# Patient Record
Sex: Female | Born: 1950
Health system: Southern US, Community
[De-identification: ages and names within clinical notes are randomized; demographics above are authoritative.]

## PROBLEM LIST (undated history)

## (undated) DIAGNOSIS — K5792 Diverticulitis of intestine, part unspecified, without perforation or abscess without bleeding: Secondary | ICD-10-CM

## (undated) DIAGNOSIS — N289 Disorder of kidney and ureter, unspecified: Secondary | ICD-10-CM

## (undated) DIAGNOSIS — L570 Actinic keratosis: Secondary | ICD-10-CM

## (undated) DIAGNOSIS — K589 Irritable bowel syndrome without diarrhea: Secondary | ICD-10-CM

## (undated) HISTORY — PX: BREAST SURGERY: SHX581

## (undated) HISTORY — DX: Actinic keratosis: L57.0

---

## 1990-07-21 HISTORY — PX: BREAST EXCISIONAL BIOPSY: SUR124

## 2004-10-18 ENCOUNTER — Ambulatory Visit: Payer: Self-pay | Admitting: Obstetrics and Gynecology

## 2005-06-19 ENCOUNTER — Ambulatory Visit: Payer: Self-pay | Admitting: Unknown Physician Specialty

## 2005-07-10 ENCOUNTER — Ambulatory Visit: Payer: Self-pay | Admitting: Unknown Physician Specialty

## 2005-10-20 ENCOUNTER — Ambulatory Visit: Payer: Self-pay | Admitting: Obstetrics and Gynecology

## 2006-11-24 ENCOUNTER — Ambulatory Visit: Payer: Self-pay | Admitting: Obstetrics and Gynecology

## 2007-11-26 ENCOUNTER — Ambulatory Visit: Payer: Self-pay | Admitting: Obstetrics and Gynecology

## 2008-02-04 ENCOUNTER — Ambulatory Visit: Payer: Self-pay | Admitting: Unknown Physician Specialty

## 2008-09-26 ENCOUNTER — Encounter (HOSPITAL_COMMUNITY): Admission: RE | Admit: 2008-09-26 | Discharge: 2008-10-26 | Payer: Self-pay | Admitting: Internal Medicine

## 2008-11-27 ENCOUNTER — Ambulatory Visit: Payer: Self-pay | Admitting: Obstetrics and Gynecology

## 2010-08-05 ENCOUNTER — Ambulatory Visit (HOSPITAL_COMMUNITY)
Admission: RE | Admit: 2010-08-05 | Discharge: 2010-08-05 | Payer: Self-pay | Source: Home / Self Care | Attending: Family Medicine | Admitting: Family Medicine

## 2011-09-11 ENCOUNTER — Other Ambulatory Visit (HOSPITAL_COMMUNITY): Payer: Self-pay | Admitting: Family Medicine

## 2011-09-11 DIAGNOSIS — Z139 Encounter for screening, unspecified: Secondary | ICD-10-CM

## 2011-09-18 ENCOUNTER — Ambulatory Visit (HOSPITAL_COMMUNITY)
Admission: RE | Admit: 2011-09-18 | Discharge: 2011-09-18 | Disposition: A | Discharge status: Home / Self Care | Payer: PRIVATE HEALTH INSURANCE | Source: Ambulatory Visit | Attending: Family Medicine | Admitting: Family Medicine

## 2011-09-18 DIAGNOSIS — Z1231 Encounter for screening mammogram for malignant neoplasm of breast: Secondary | ICD-10-CM | POA: Insufficient documentation

## 2011-09-18 DIAGNOSIS — Z139 Encounter for screening, unspecified: Secondary | ICD-10-CM

## 2013-01-26 ENCOUNTER — Ambulatory Visit: Payer: Self-pay | Admitting: Obstetrics and Gynecology

## 2013-03-03 ENCOUNTER — Ambulatory Visit: Payer: Self-pay | Admitting: Unknown Physician Specialty

## 2013-03-07 LAB — PATHOLOGY REPORT

## 2013-04-20 ENCOUNTER — Ambulatory Visit: Payer: Self-pay | Admitting: Obstetrics and Gynecology

## 2013-04-20 HISTORY — PX: BLADDER SURGERY: SHX569

## 2013-04-20 LAB — URINALYSIS, COMPLETE
Glucose,UR: NEGATIVE mg/dL (ref 0–75)
WBC UR: 1 /HPF (ref 0–5)

## 2013-04-20 LAB — HEMOGLOBIN: HGB: 14 g/dL (ref 12.0–16.0)

## 2013-04-25 ENCOUNTER — Ambulatory Visit: Payer: Self-pay | Admitting: Obstetrics and Gynecology

## 2013-04-26 LAB — HEMATOCRIT: HCT: 33.9 % — ABNORMAL LOW (ref 35.0–47.0)

## 2013-04-29 ENCOUNTER — Encounter (HOSPITAL_COMMUNITY): Payer: Self-pay | Admitting: Emergency Medicine

## 2013-04-29 ENCOUNTER — Emergency Department (HOSPITAL_COMMUNITY): Payer: BC Managed Care – PPO

## 2013-04-29 ENCOUNTER — Emergency Department (HOSPITAL_COMMUNITY)
Admission: EM | Admit: 2013-04-29 | Discharge: 2013-04-29 | Disposition: A | Discharge status: Home / Self Care | Payer: BC Managed Care – PPO | Attending: Emergency Medicine | Admitting: Emergency Medicine

## 2013-04-29 DIAGNOSIS — R350 Frequency of micturition: Secondary | ICD-10-CM | POA: Insufficient documentation

## 2013-04-29 DIAGNOSIS — M545 Low back pain, unspecified: Secondary | ICD-10-CM | POA: Insufficient documentation

## 2013-04-29 DIAGNOSIS — Z79899 Other long term (current) drug therapy: Secondary | ICD-10-CM | POA: Insufficient documentation

## 2013-04-29 DIAGNOSIS — R109 Unspecified abdominal pain: Secondary | ICD-10-CM | POA: Insufficient documentation

## 2013-04-29 DIAGNOSIS — R339 Retention of urine, unspecified: Secondary | ICD-10-CM | POA: Insufficient documentation

## 2013-04-29 DIAGNOSIS — R3915 Urgency of urination: Secondary | ICD-10-CM | POA: Insufficient documentation

## 2013-04-29 DIAGNOSIS — Z9889 Other specified postprocedural states: Secondary | ICD-10-CM | POA: Insufficient documentation

## 2013-04-29 LAB — CBC WITH DIFFERENTIAL/PLATELET
Basophils Absolute: 0 10*3/uL (ref 0.0–0.1)
Basophils Relative: 0 % (ref 0–1)
Eosinophils Absolute: 0.1 10*3/uL (ref 0.0–0.7)
Eosinophils Relative: 2 % (ref 0–5)
HCT: 41.2 % (ref 36.0–46.0)
Hemoglobin: 14.7 g/dL (ref 12.0–15.0)
MCH: 31.1 pg (ref 26.0–34.0)
MCHC: 35.7 g/dL (ref 30.0–36.0)
Monocytes Absolute: 0.5 10*3/uL (ref 0.1–1.0)
Monocytes Relative: 6 % (ref 3–12)
RDW: 12.7 % (ref 11.5–15.5)

## 2013-04-29 LAB — URINALYSIS, ROUTINE W REFLEX MICROSCOPIC
Bilirubin Urine: NEGATIVE
Glucose, UA: NEGATIVE mg/dL
Ketones, ur: NEGATIVE mg/dL
pH: 8.5 — ABNORMAL HIGH (ref 5.0–8.0)

## 2013-04-29 LAB — BASIC METABOLIC PANEL
BUN: 11 mg/dL (ref 6–23)
Calcium: 10.3 mg/dL (ref 8.4–10.5)
Creatinine, Ser: 0.79 mg/dL (ref 0.50–1.10)
GFR calc Af Amer: >90 mL/min (ref >90)
GFR calc non Af Amer: 89 mL/min — ABNORMAL LOW (ref >90)

## 2013-04-29 LAB — URINE MICROSCOPIC-ADD ON

## 2013-04-29 MED ORDER — HYDROMORPHONE HCL PF 1 MG/ML IJ SOLN
1.0000 mg | Freq: Once | INTRAMUSCULAR | Status: AC
  Administered 2013-04-29: 1 mg via INTRAVENOUS
  Filled 2013-04-29: qty 1

## 2013-04-29 MED ORDER — DIAZEPAM 5 MG PO TABS
5.0000 mg | ORAL_TABLET | Freq: Once | ORAL | Status: AC
  Administered 2013-04-29: 5 mg via ORAL
  Filled 2013-04-29: qty 1

## 2013-04-29 MED ORDER — KETOROLAC TROMETHAMINE 30 MG/ML IJ SOLN
30.0000 mg | Freq: Once | INTRAMUSCULAR | Status: AC
  Administered 2013-04-29: 30 mg via INTRAVENOUS

## 2013-04-29 MED ORDER — ONDANSETRON HCL 4 MG/2ML IJ SOLN: 4.0000 mg | Freq: Once | INTRAMUSCULAR | Status: DC

## 2013-04-29 MED ORDER — DIPHENHYDRAMINE HCL 50 MG/ML IJ SOLN
INTRAMUSCULAR | Status: AC
  Administered 2013-04-29: 25 mg via INTRAVENOUS
  Filled 2013-04-29: qty 1

## 2013-04-29 MED ORDER — FENTANYL CITRATE 0.05 MG/ML IJ SOLN
50.0000 ug | Freq: Once | INTRAMUSCULAR | Status: AC
  Administered 2013-04-29: 50 ug via INTRAVENOUS

## 2013-04-29 MED ORDER — IOHEXOL 300 MG/ML  SOLN
100.0000 mL | Freq: Once | INTRAMUSCULAR | Status: AC | PRN
  Administered 2013-04-29: 100 mL via INTRAVENOUS

## 2013-04-29 MED ORDER — KETOROLAC TROMETHAMINE 30 MG/ML IJ SOLN
INTRAMUSCULAR | Status: AC
  Administered 2013-04-29: 30 mg via INTRAVENOUS
  Filled 2013-04-29: qty 1

## 2013-04-29 MED ORDER — ONDANSETRON HCL 4 MG/2ML IJ SOLN
INTRAMUSCULAR | Status: AC
  Filled 2013-04-29: qty 2

## 2013-04-29 MED ORDER — IOHEXOL 300 MG/ML  SOLN
50.0000 mL | Freq: Once | INTRAMUSCULAR | Status: AC | PRN
  Administered 2013-04-29: 50 mL via ORAL

## 2013-04-29 MED ORDER — DIPHENHYDRAMINE HCL 50 MG/ML IJ SOLN
25.0000 mg | Freq: Once | INTRAMUSCULAR | Status: AC
  Administered 2013-04-29: 25 mg via INTRAVENOUS

## 2013-04-29 MED ORDER — FENTANYL CITRATE 0.05 MG/ML IJ SOLN
INTRAMUSCULAR | Status: AC
  Filled 2013-04-29: qty 2

## 2013-04-29 MED ORDER — ONDANSETRON HCL 4 MG/2ML IJ SOLN
4.0000 mg | Freq: Once | INTRAMUSCULAR | Status: AC
  Administered 2013-04-29: 4 mg via INTRAVENOUS

## 2013-04-29 NOTE — ED Notes (Signed)
Inserted 57F foley catheter w/return of 600 cc + clear yellow urine.  Patient experiencing immediate relief of pain.

## 2013-04-29 NOTE — ED Notes (Signed)
Patient states she has not had BM since Sunday.

## 2013-04-29 NOTE — ED Notes (Signed)
Patient c/o itching all over. Gave 25mg  Benadryl IV for itching.

## 2013-04-29 NOTE — ED Notes (Signed)
Patient transported to X-ray 

## 2013-04-29 NOTE — ED Provider Notes (Signed)
CSN: [id number]     Arrival date & time 04/29/13  1414 History   First MD Initiated Contact with Patient 04/29/13 1507    This chart was scribed for Vida Roller, MD by Manuela Schwartz, ED scribe. This patient was seen in room APA02/APA02 and the patient's care was started at 1414.   Chief Complaint  Patient presents with  . Post-op Problem   The history is provided by the patient. No language interpreter was used.   HPI Comments: Carolyn Cunningham is a 62 y.o. female who presents to the Emergency Department complaining of constant, gradually worsening, moderate lower abdominal pain, onset 2 days ago. She had transvaginal bladder tack surgery 4 days ago at [facility].  If that is episodic, crampy in nature and has become severe today. It does get better with Norco, she was placed on Macrobid after the procedure and has increased this to twice daily. She has had urinary urgency with her symptoms but has minimal urinary passage. There is no hematuria, no vaginal bleeding over 24 hours and no problems with bowel habits.  She does complain of mild nausea that she relates to taking the medications. She also has developed lower back pain.  History reviewed. No pertinent past medical history. Past Surgical History  Procedure Laterality Date  . Bladder surgery    . Breast surgery     No family history on file. History  Substance Use Topics  . Smoking status: Never Smoker   . Smokeless tobacco: Not on file  . Alcohol Use: No   OB History   Grav Para Term Preterm Abortions TAB SAB Ect Mult Living                 Review of Systems  Constitutional: Negative for fever and chills.  Respiratory: Negative for shortness of breath.   Gastrointestinal: Positive for nausea and abdominal pain (lower abdominal pain). Negative for vomiting.  Genitourinary: Positive for urgency, frequency and flank pain.  Neurological: Negative for weakness.  All other systems reviewed and are negative.   A complete 10 system  review of systems was obtained and all systems are negative except as noted in the HPI and PMH.   Allergies  Sulfa antibiotics  Home Medications   Current Outpatient Rx  Name  Route  Sig  Dispense  Refill  . BIOTIN PO   Oral   Take 1 tablet by mouth daily.         . Calcium Carbonate-Vitamin D (CALCIUM + D PO)   Oral   Take 1 tablet by mouth daily.         . Cholecalciferol (VITAMIN D PO)   Oral   Take 1 tablet by mouth daily.         . fesoterodine (TOVIAZ) 4 MG TB24 tablet   Oral   Take 4 mg by mouth daily.         . fish oil-omega-3 fatty acids 1000 MG capsule   Oral   Take 1 g by mouth daily.         Marland Kitchen HYDROcodone-acetaminophen (NORCO/VICODIN) 5-325 MG per tablet   Oral   Take 0.5-2 tablets by mouth every 6 (six) hours as needed for pain.         Marland Kitchen MAGNESIUM PO   Oral   Take by mouth.         . Misc Natural Products (OSTEO BI-FLEX ADV JOINT SHIELD PO)   Oral   Take 1 tablet by mouth  daily.         . Multiple Vitamins-Minerals (ZINC PO)   Oral   Take 1 tablet by mouth daily.         . nitrofurantoin, macrocrystal-monohydrate, (MACROBID) 100 MG capsule   Oral   Take 100 mg by mouth 2 (two) times daily. 7 day course beginning on 04/27/13. Recent increase from one capsule daily to two capsules as directed per MD         . POTASSIUM PO   Oral   Take 1 tablet by mouth daily.         . Pyridoxine HCl (B-6 PO)   Oral   Take 1 tablet by mouth daily.          Triage Vitals: BP 148/129  Pulse 92  Temp(Src) 98.4 F (36.9 C) (Oral)  Resp 22  Ht 5\' 3"  (1.6 m)  Wt 158 lb (71.668 kg)  BMI 28 kg/m2  SpO2 100% Physical Exam  Nursing note and vitals reviewed. Constitutional: She is oriented to person, place, and time. She appears well-developed and well-nourished. She appears distressed.  She appears uncomfortable and in pain.  HENT:  Head: Normocephalic and atraumatic.  Eyes: Conjunctivae are normal. Right eye exhibits no discharge.  Left eye exhibits no discharge.  Neck: Neck supple. No tracheal deviation present.  Cardiovascular: Normal rate, regular rhythm, normal heart sounds and intact distal pulses.   Pulmonary/Chest: Effort normal and breath sounds normal. No respiratory distress.  Abdominal: Soft. She exhibits no mass. There is tenderness. There is no rebound.  bilateral lower and suprapubic w/mild guarding.  No peritoneal signs, very soft and nontender upper abdomen.  Genitourinary:  Chaperone present. Mild bruising to the lower mons, upper external vaginal folds. No obvious d/c or bleeding. Internal exam deferred secondary to pain.   Musculoskeletal: Normal range of motion. She exhibits no edema.  Neurological: She is alert and oriented to person, place, and time. Coordination normal.  Skin: Skin is warm.  Psychiatric: She has a normal mood and affect. Her behavior is normal.    ED Course  Procedures (including critical care time) DIAGNOSTIC STUDIES: Oxygen Saturation is 100% on room air, normal by my interpretation.    COORDINATION OF CARE: At 325 PM Discussed treatment plan with patient which includes laboratory workup and a CT scan of the abdomen to evaluate for postoperative complications.. Patient agrees.   Labs Review Labs Reviewed  URINALYSIS, ROUTINE W REFLEX MICROSCOPIC - Abnormal; Notable for the following:    APPearance CLOUDY (*)    pH 8.5 (*)    Hgb urine dipstick LARGE (*)    Protein, ur TRACE (*)    Leukocytes, UA SMALL (*)    All other components within normal limits  BASIC METABOLIC PANEL - Abnormal; Notable for the following:    Glucose, Bld 114 (*)    GFR calc non Af Amer 89 (*)    All other components within normal limits  URINE MICROSCOPIC-ADD ON - Abnormal; Notable for the following:    Bacteria, UA MANY (*)    All other components within normal limits  URINE CULTURE  CBC WITH DIFFERENTIAL   Imaging Review Ct Abdomen Pelvis W Contrast  04/29/2013   CLINICAL DATA:  Lower  abdominal pain.  EXAM: CT ABDOMEN AND PELVIS WITH CONTRAST  TECHNIQUE: Multidetector CT imaging of the abdomen and pelvis was performed using the standard protocol following bolus administration of intravenous contrast.  CONTRAST:  50mL OMNIPAQUE IOHEXOL 300 MG/ML SOLN, OMNIPAQUE IOHEXOL  300 MG/ML SOLN  COMPARISON:  None.  FINDINGS: Visualized lung bases appear normal. Solitary gallstone is noted. Several hepatic cysts are noted. Spleen and pancreas appear normal. Adrenal glands appear normal. Mild bilateral hydronephrosis is noted as well as mild ureteral dilatation bilaterally. No obstructing calculus is noted and this appears to be due to distended urinary bladder. No evidence of bowel obstruction is noted. Uterus appears normal. 2.5 cm right ovarian cyst is noted. Small amount of free fluid is noted in the dependent portion the pelvis which may be physiologic. Multiple phleboliths are noted in the pelvis. The appendix appears normal.  IMPRESSION: Moderate to severe urinary bladder distention is noted with results and ureteral dilatation and mild bilateral hydronephrosis. No obstructing calculus is noted. Solitary gallstone is noted.   Electronically Signed   By: Roque Lias M.D.   On: 04/29/2013 17:48    EKG Interpretation   None       MDM   1. Urinary retention   2. Abdominal pain    At this time the patient does appear to be very uncomfortable, this could be just a simple as bladder spasms post procedure however I would also consider that there could be a postoperative complications such as bleeding, perforation, including injury to urinary structure such as the urinary bladder, ureters.  Her vital signs are significant only for mild hypertension, there is no fever, no tachycardia and no hypoxia. Her exam was limited secondary to pain in her lower abdomen which appears to be a colicky severe lower abdominal pain.  CT scan reveals that the patient has a distended urinary bladder.  Foley  catheter placed with symptomatic relief. Recommend ongoing antibiotics, followup in 7 days for Foley removal. Patient has expressed understanding.  I personally performed the services described in this documentation, which was scribed in my presence. The recorded information has been reviewed and is accurate.        Vida Roller, MD 04/29/13 [phone]

## 2013-04-29 NOTE — ED Notes (Signed)
Pt had bladder surgery on Monday, had cath removed yesterday (pt did herself), now having spasms and increased pain, "feels like my bladder is falling out".   Had surgery at [facility].  Increased her antibiotic and encouraged to take her pain meds. No nausea, no vomiting, no fever or diarrhea. Pt's only complaint is pain.

## 2013-04-29 NOTE — ED Notes (Signed)
Patient with no complaints at this time. Respirations even and unlabored. Skin warm/dry. Discharge instructions reviewed with patient at this time. Patient given opportunity to voice concerns/ask questions. IV removed per policy and band-aid applied to site. Patient discharged at this time and left Emergency Department with steady gait.  

## 2013-04-29 NOTE — ED Notes (Signed)
Placed leg bag on foley catheter.

## 2013-05-01 LAB — URINE CULTURE

## 2014-03-07 ENCOUNTER — Ambulatory Visit: Payer: Self-pay | Admitting: Obstetrics and Gynecology

## 2014-11-10 NOTE — Op Note (Signed)
PATIENT NAME:  Carolyn Cunningham, Carolyn Cunningham MR#:  [id number] DATE OF BIRTH:  04/22/1951  DATE OF PROCEDURE:  04/25/2013  PREOPERATIVE DIAGNOSES: Cystocele, and genuine stress incontinence.   POSTOPERATIVE DIAGNOSES:  Cystocele, and genuine stress incontinence.   PROCEDURE: Anterior repair and tension-free vaginal tape.   SURGEON: Loyd Marhefka.   ANESTHESIA: General endotracheal.   FINDINGS: Moderate cystocele. Known urethral hypermobility which demonstrates thoracic continence. Cystoscopy showed normal intravesicular anatomy and no evidence of trauma from obturator placement.   ESTIMATED BLOOD LOSS: 125 mL.   COMPLICATIONS: None.   DRAINS: Foley.   PACKING: Present (soaked in Premarin).   LOCAL: Used approximately 30 mL of 0.5% Sensorcaine with epinephrine.   PROCEDURE IN DETAIL: The patient was consented in the operating room and placed in the supine position where anesthesia was initiated. The patient was placed in the dorsal lithotomy position using Allen stirrups, prepped and draped in the usual sterile fashion.   On cystoscope was noted. A small Allis was placed approximately 4 cm from the external urethral orifice and was placed at the most cephalad portion of the defect. This area was injected with epinephrine. A 15-blade was used to incise longitudinally. Tissue was bluntly dissected bluntly and sharply and dissected free. Areas were made hemostatic. A gathering stitch was done with 0 Vicryl in a pursestring fashion, and then the remainder of the defect was obliterated with horizontal mattress sutures of 0 Vicryl. Excess skin was trimmed free and skin was closed with running interlocking #0 Vicryl.   A small Allis was then placed approximately 2 cm from the external urethral orifice and the second was placed just distal to the defect that was closed previously. This area was injected with 0.5% Sensorcaine with epinephrine, incised longitudinally, and then the tissue beneath was injected with the  epinephrine/Sensorcaine solution. Periurethral tissue was injected bilaterally. Was carried up to the skin retro- and suprapubically, and then skin incision was made after creating a wheal with solution, stab-style incision with a 15-blade.   Foley catheter was placed with obturator. Urethra was deviated to the patient's left and right arm. The obturator was placed without difficulty, and cystoscopic evaluation ensured no entrance to the  urethra or bladder. We proceeded in a similar fashion on the left with, deviation of the urethra to the patient's right.   A Claiborne Billings was used to stabilize the mesh beneath the urethra and mesh was pulled snug and then plastic sheathing was removed.   On removal of the right arm of the sheathing there was some vigorous bleeding noted. Pressure was applied for 2 minutes. Was decreased to flow by approximately one-half. Pressure was applied for 2 more minutes and then inspected and there was no bleeding noted. The area was completely dry.   Cystoscope was inserted and repeated to ensure that the flow had not been urine and the bladder was to be half full and half clear.   Area was observed for approximately 5 more minutes. Again, no oozing was noted and the defect was then closed with a running, interlocking 0 Vicryl.   Packing soaked in Premarin was placed. The catheter was placed without the obturator. The bulb was inflated. Excess packing was tied to the catheter and the catheter was hooked to the Foley bag.   The patient tolerated the procedure well. She was left in the care of anesthesia. I anticipate a routine postoperative course. She will be discharged home in the morning. I anticipate her need to wear a catheter home. We will  watch for any recurrence of vigorous bleeding.    ____________________________ Rockey Situ. Amalia Hailey, MD rle:dm D: 04/25/2013 09:02:01 ET T: 04/25/2013 09:22:21 ET JOB#: [id number]  cc: Audry Pili L. Amalia Hailey, MD, <Dictator> [facility]  MD ELECTRONICALLY SIGNED 04/25/2013 10:15

## 2015-03-12 ENCOUNTER — Other Ambulatory Visit: Payer: Self-pay | Admitting: Obstetrics and Gynecology

## 2015-03-12 DIAGNOSIS — Z1231 Encounter for screening mammogram for malignant neoplasm of breast: Secondary | ICD-10-CM

## 2015-04-13 ENCOUNTER — Ambulatory Visit
Admission: RE | Admit: 2015-04-13 | Discharge: 2015-04-13 | Disposition: A | Discharge status: Home / Self Care | Payer: 59 | Source: Ambulatory Visit | Attending: Obstetrics and Gynecology | Admitting: Obstetrics and Gynecology

## 2015-04-13 DIAGNOSIS — Z1231 Encounter for screening mammogram for malignant neoplasm of breast: Secondary | ICD-10-CM | POA: Diagnosis present

## 2016-03-12 ENCOUNTER — Other Ambulatory Visit: Payer: Self-pay | Admitting: Obstetrics and Gynecology

## 2016-03-12 DIAGNOSIS — Z1231 Encounter for screening mammogram for malignant neoplasm of breast: Secondary | ICD-10-CM

## 2016-04-16 ENCOUNTER — Ambulatory Visit: Payer: 59

## 2016-04-17 ENCOUNTER — Ambulatory Visit
Admission: RE | Admit: 2016-04-17 | Discharge: 2016-04-17 | Disposition: A | Discharge status: Home / Self Care | Payer: 59 | Source: Ambulatory Visit | Attending: Obstetrics and Gynecology | Admitting: Obstetrics and Gynecology

## 2016-04-17 ENCOUNTER — Other Ambulatory Visit: Payer: Self-pay | Admitting: Obstetrics and Gynecology

## 2016-04-17 DIAGNOSIS — Z1231 Encounter for screening mammogram for malignant neoplasm of breast: Secondary | ICD-10-CM

## 2016-07-28 DIAGNOSIS — M5442 Lumbago with sciatica, left side: Secondary | ICD-10-CM | POA: Diagnosis not present

## 2016-07-28 DIAGNOSIS — M9903 Segmental and somatic dysfunction of lumbar region: Secondary | ICD-10-CM | POA: Diagnosis not present

## 2016-07-28 DIAGNOSIS — M9902 Segmental and somatic dysfunction of thoracic region: Secondary | ICD-10-CM | POA: Diagnosis not present

## 2016-07-29 DIAGNOSIS — M9902 Segmental and somatic dysfunction of thoracic region: Secondary | ICD-10-CM | POA: Diagnosis not present

## 2016-07-29 DIAGNOSIS — M9903 Segmental and somatic dysfunction of lumbar region: Secondary | ICD-10-CM | POA: Diagnosis not present

## 2016-07-29 DIAGNOSIS — M5442 Lumbago with sciatica, left side: Secondary | ICD-10-CM | POA: Diagnosis not present

## 2016-07-30 DIAGNOSIS — L298 Other pruritus: Secondary | ICD-10-CM | POA: Diagnosis not present

## 2016-07-30 DIAGNOSIS — B850 Pediculosis due to Pediculus humanus capitis: Secondary | ICD-10-CM | POA: Diagnosis not present

## 2016-07-30 DIAGNOSIS — L981 Factitial dermatitis: Secondary | ICD-10-CM | POA: Diagnosis not present

## 2016-08-01 DIAGNOSIS — M9903 Segmental and somatic dysfunction of lumbar region: Secondary | ICD-10-CM | POA: Diagnosis not present

## 2016-08-01 DIAGNOSIS — M5442 Lumbago with sciatica, left side: Secondary | ICD-10-CM | POA: Diagnosis not present

## 2016-08-01 DIAGNOSIS — M9902 Segmental and somatic dysfunction of thoracic region: Secondary | ICD-10-CM | POA: Diagnosis not present

## 2016-08-04 DIAGNOSIS — M5442 Lumbago with sciatica, left side: Secondary | ICD-10-CM | POA: Diagnosis not present

## 2016-08-04 DIAGNOSIS — M9903 Segmental and somatic dysfunction of lumbar region: Secondary | ICD-10-CM | POA: Diagnosis not present

## 2016-08-04 DIAGNOSIS — M9902 Segmental and somatic dysfunction of thoracic region: Secondary | ICD-10-CM | POA: Diagnosis not present

## 2016-08-07 DIAGNOSIS — M9903 Segmental and somatic dysfunction of lumbar region: Secondary | ICD-10-CM | POA: Diagnosis not present

## 2016-08-07 DIAGNOSIS — M9902 Segmental and somatic dysfunction of thoracic region: Secondary | ICD-10-CM | POA: Diagnosis not present

## 2016-08-07 DIAGNOSIS — M5442 Lumbago with sciatica, left side: Secondary | ICD-10-CM | POA: Diagnosis not present

## 2016-08-14 DIAGNOSIS — M5442 Lumbago with sciatica, left side: Secondary | ICD-10-CM | POA: Diagnosis not present

## 2016-08-14 DIAGNOSIS — M9902 Segmental and somatic dysfunction of thoracic region: Secondary | ICD-10-CM | POA: Diagnosis not present

## 2016-08-14 DIAGNOSIS — M9903 Segmental and somatic dysfunction of lumbar region: Secondary | ICD-10-CM | POA: Diagnosis not present

## 2016-09-11 DIAGNOSIS — M9903 Segmental and somatic dysfunction of lumbar region: Secondary | ICD-10-CM | POA: Diagnosis not present

## 2016-09-11 DIAGNOSIS — M9902 Segmental and somatic dysfunction of thoracic region: Secondary | ICD-10-CM | POA: Diagnosis not present

## 2016-09-11 DIAGNOSIS — M5442 Lumbago with sciatica, left side: Secondary | ICD-10-CM | POA: Diagnosis not present

## 2016-10-20 DIAGNOSIS — M5442 Lumbago with sciatica, left side: Secondary | ICD-10-CM | POA: Diagnosis not present

## 2016-10-20 DIAGNOSIS — M9905 Segmental and somatic dysfunction of pelvic region: Secondary | ICD-10-CM | POA: Diagnosis not present

## 2016-10-20 DIAGNOSIS — M9903 Segmental and somatic dysfunction of lumbar region: Secondary | ICD-10-CM | POA: Diagnosis not present

## 2016-10-23 DIAGNOSIS — M5442 Lumbago with sciatica, left side: Secondary | ICD-10-CM | POA: Diagnosis not present

## 2016-10-23 DIAGNOSIS — M9903 Segmental and somatic dysfunction of lumbar region: Secondary | ICD-10-CM | POA: Diagnosis not present

## 2016-10-23 DIAGNOSIS — M9905 Segmental and somatic dysfunction of pelvic region: Secondary | ICD-10-CM | POA: Diagnosis not present

## 2016-11-20 ENCOUNTER — Other Ambulatory Visit: Payer: Self-pay | Admitting: Internal Medicine

## 2016-11-20 DIAGNOSIS — M5116 Intervertebral disc disorders with radiculopathy, lumbar region: Secondary | ICD-10-CM | POA: Diagnosis not present

## 2016-12-03 ENCOUNTER — Ambulatory Visit
Admission: RE | Admit: 2016-12-03 | Discharge: 2016-12-03 | Disposition: A | Discharge status: Home / Self Care | Payer: Commercial Managed Care - HMO | Source: Ambulatory Visit | Attending: Internal Medicine | Admitting: Internal Medicine

## 2016-12-03 ENCOUNTER — Other Ambulatory Visit
Admission: RE | Admit: 2016-12-03 | Discharge: 2016-12-03 | Disposition: A | Discharge status: Home / Self Care | Payer: 59 | Source: Ambulatory Visit | Attending: Internal Medicine | Admitting: Internal Medicine

## 2016-12-03 DIAGNOSIS — M48061 Spinal stenosis, lumbar region without neurogenic claudication: Secondary | ICD-10-CM | POA: Diagnosis not present

## 2016-12-03 DIAGNOSIS — M5127 Other intervertebral disc displacement, lumbosacral region: Secondary | ICD-10-CM | POA: Diagnosis not present

## 2016-12-03 DIAGNOSIS — M5116 Intervertebral disc disorders with radiculopathy, lumbar region: Secondary | ICD-10-CM | POA: Insufficient documentation

## 2016-12-03 DIAGNOSIS — M4807 Spinal stenosis, lumbosacral region: Secondary | ICD-10-CM | POA: Diagnosis not present

## 2016-12-03 LAB — CREATININE, SERUM
CREATININE: 0.69 mg/dL (ref 0.44–1.00)
GFR calc Af Amer: >60 mL/min (ref >60)

## 2016-12-03 LAB — BUN: BUN: 18 mg/dL (ref 6–20)

## 2016-12-03 MED ORDER — GADOBENATE DIMEGLUMINE 529 MG/ML IV SOLN
14.0000 mL | Freq: Once | INTRAVENOUS | Status: AC | PRN
  Administered 2016-12-03: 14 mL via INTRAVENOUS

## 2016-12-08 DIAGNOSIS — D229 Melanocytic nevi, unspecified: Secondary | ICD-10-CM | POA: Diagnosis not present

## 2016-12-08 DIAGNOSIS — L820 Inflamed seborrheic keratosis: Secondary | ICD-10-CM | POA: Diagnosis not present

## 2016-12-08 DIAGNOSIS — Z1283 Encounter for screening for malignant neoplasm of skin: Secondary | ICD-10-CM | POA: Diagnosis not present

## 2016-12-08 DIAGNOSIS — Z85828 Personal history of other malignant neoplasm of skin: Secondary | ICD-10-CM | POA: Diagnosis not present

## 2016-12-10 ENCOUNTER — Ambulatory Visit: Payer: 59

## 2016-12-10 DIAGNOSIS — M5416 Radiculopathy, lumbar region: Secondary | ICD-10-CM | POA: Diagnosis not present

## 2016-12-24 ENCOUNTER — Ambulatory Visit (HOSPITAL_COMMUNITY): Payer: 59 | Attending: Neurosurgery | Admitting: Physical Therapy

## 2016-12-24 ENCOUNTER — Encounter (HOSPITAL_COMMUNITY): Payer: Self-pay | Admitting: Physical Therapy

## 2016-12-24 DIAGNOSIS — M545 Low back pain: Secondary | ICD-10-CM | POA: Diagnosis not present

## 2016-12-25 NOTE — Therapy (Signed)
[place], [place], [address] Phone: [phone]   Fax:  [phone]  Physical Therapy Evaluation/One time visit   Patient Details  Name: Carolyn Cunningham MRN: [id number] Date of Birth: [birth year] Referring Provider: Meade Maw, MD   Encounter Date: 12/24/2016      PT End of Session - 12/25/16 0855    Visit Number 1   Number of Visits 1   Authorization Type [employer]   Authorization Time Period 12/24/16 to 12/25/16   PT Start Time 1435   PT Stop Time 1515   PT Time Calculation (min) 40 min   Activity Tolerance Patient tolerated treatment well;No increased pain   Behavior During Therapy [facility] for tasks assessed/performed      History reviewed. No pertinent past medical history.  Past Surgical History:  Procedure Laterality Date  . BLADDER SURGERY    . BREAST BIOPSY Left 1992   EXCISIONAL - NEG  . BREAST SURGERY      There were no vitals filed for this visit.       Subjective Assessment - 12/24/16 1439    Subjective Pt reports that her back was bothering her back in 2010 so she went to PT with improvements. 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 After trying chiropractor and injections, she went to her PCP who took an MRI finding 3 bulging discs. She was referred to neurology who then referred her here to OPPT. After finishing her prednisone, she noticed her pain fully resolved (beginning of May).   Pertinent History LBP in 2010   How long can you sit comfortably? unlimited    How long can you stand comfortably? unlimited    How long can you walk comfortably? unlimited   Diagnostic tests MRI: disc bulge   Patient Stated Goals prevent back issues in the future   Currently in Pain? No/denies            [facility] PT Assessment - 12/25/16 0001      Assessment   Medical Diagnosis Lumbar radiculopathy    Referring Provider Meade Maw, MD    Onset  Date/Surgical Date --  end of March 2018   Next MD Visit none for now   Prior Therapy 2010 OPPT which helped her back     Restrictions   Weight Bearing Restrictions No     Balance Screen   Has the patient fallen in the past 6 months No   Has the patient had a decrease in activity level because of a fear of falling?  No   Is the patient reluctant to leave their home because of a fear of falling?  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     Cognition   Overall Cognitive Status Within Functional Limits for tasks assessed     Sensation   Light Touch Appears Intact     AROM   Overall AROM Comments Lumbar AROM flexion/extension full and pain free      Strength   Right Hip Flexion 5/5   Right Hip Extension 5/5   Right Hip ABduction 5/5   Left Hip Flexion 5/5   Left Hip Extension 5/5   Left Hip ABduction 5/5   Right Knee Flexion 5/5   Right Knee Extension 5/5   Left Knee Flexion 5/5   Left Knee  Extension 5/5   Right Ankle Dorsiflexion 5/5   Left Ankle Dorsiflexion 5/5     Palpation   Palpation comment non tender with palpation to lumbar region     High Level Balance   High Level Balance Comments (+) mild trendelenburg on the Lt during single leg stance; able to stand on each LE 20 sec or greater             Objective measurements completed on examination: See above findings.          [facility] Adult PT Treatment/Exercise - 12/25/16 0001      Transfers   Five time sit to stand comments  7 sec, (+) hip adduction      Ambulation/Gait   Pre-Gait Activities Ascends/descends 6" steps without handrails, using reciprocal pattern and without difficulty or pain      Therapeutic Activites    Therapeutic Activities Lifting   Lifting proper lifting mechanics from the floor, reviewed proper mechanics when performing pt's transfers     Exercises   Exercises Lumbar     Lumbar  Exercises: Supine   Dead Bug 5 reps   Dead Bug Limitations each side for HEP demo      Lumbar Exercises: Prone   Opposite Arm/Leg Raise 5 reps;Right arm/Left leg;Left arm/Right leg   Opposite Arm/Leg Raise Limitations HEP demo                 PT Education - 12/25/16 0851    Education provided Yes   Education Details eval findings; POC; importance of adjusting lifting mechanics at work to prevent re-injury to her back;  implemented and reviewed HEP to maintain core strength and sitting posture at work   [employer]) Educated Patient   Methods Explanation;Demonstration;Verbal cues;Handout   Comprehension Verbalized understanding;Returned demonstration          PT Short Term Goals - 12/25/16 1227      PT SHORT TERM GOAL #1   Title Pt will demo understanding of proper lifting mechanics to decrease risk of reinjury to her low back with daily work activities.   Time 1   Period Days   Status Achieved                   Plan - 12/25/16 0857    Clinical Impression Statement Pt is a pleasant 66 yo F referred to OPPT with previous issues of low back pain with radiculopathy. She arrives today after following other conservative measures, with reported resolution of her symptoms for the past several weeks with full return to her daily work/home activity. She demonstrates full lumbar AROM without pain, full BLE strength without pain and she states she currently has no limitations. Pt's job requires a lot of sitting and manual lifting/pulling and her mechanics during these activities were likely the cause of her return of symptoms several months ago. Therapist spent a large portion of the evaluation educating the pt on proper lifting mechanics from the floor and at waist level as well as educating pt on proper sitting mechanics. She was able to demonstrate good understanding of all activities reviewed. At this time, she does not require skilled PT and was encouraged to make the  adjustments discussed during today's session. She verbalized understanding and agreement.    History and Personal Factors relevant to plan of care: (+) success with PT in the past for the same issue   Clinical Presentation Stable   Clinical Presentation due to: currently pain free   Clinical  Decision Making Low   Rehab Potential Good   PT Frequency One time visit   PT Treatment/Interventions ADLs/Self Care Home Management;Therapeutic activities;Therapeutic exercise   PT Home Exercise Plan lifting mechanics; prone extension stretch on elbows, supine deadbug, prone alt LE/UE lifts, lumbar roll   Consulted and Agree with Plan of Care Patient      Patient will benefit from skilled therapeutic intervention in order to improve the following deficits and impairments:  Improper body mechanics  Visit Diagnosis: Low back pain, unspecified back pain laterality, unspecified chronicity, with sciatica presence unspecified     Problem List There are no active problems to display for this patient.   12:33 PM,12/25/16 Elly Modena PT, DPT [facility] [place] [address] [place], [place], [address] Phone: [phone]   Fax:  [phone]  Name: MARGREAT WIDENER MRN: [id number] Date of Birth: September 29, 1950

## 2017-01-06 ENCOUNTER — Other Ambulatory Visit: Payer: Self-pay | Admitting: Neurosurgery

## 2017-01-06 DIAGNOSIS — M5416 Radiculopathy, lumbar region: Secondary | ICD-10-CM

## 2017-01-12 ENCOUNTER — Ambulatory Visit
Admission: RE | Admit: 2017-01-12 | Discharge: 2017-01-12 | Disposition: A | Discharge status: Home / Self Care | Payer: 59 | Source: Ambulatory Visit | Attending: Neurosurgery | Admitting: Neurosurgery

## 2017-01-12 DIAGNOSIS — M545 Low back pain: Secondary | ICD-10-CM | POA: Diagnosis not present

## 2017-01-12 DIAGNOSIS — M5416 Radiculopathy, lumbar region: Secondary | ICD-10-CM

## 2017-01-12 DIAGNOSIS — M5127 Other intervertebral disc displacement, lumbosacral region: Secondary | ICD-10-CM | POA: Diagnosis not present

## 2017-01-12 MED ORDER — METHYLPREDNISOLONE ACETATE 40 MG/ML INJ SUSP (RADIOLOG
120.0000 mg | Freq: Once | INTRAMUSCULAR | Status: AC
  Administered 2017-01-12: 120 mg via EPIDURAL

## 2017-01-12 MED ORDER — IOPAMIDOL (ISOVUE-M 200) INJECTION 41%
1.0000 mL | Freq: Once | INTRAMUSCULAR | Status: AC
  Administered 2017-01-12: 1 mL via EPIDURAL

## 2017-01-12 NOTE — Discharge Instructions (Signed)

## 2017-03-17 ENCOUNTER — Other Ambulatory Visit: Payer: Self-pay | Admitting: Obstetrics and Gynecology

## 2017-03-17 DIAGNOSIS — Z1231 Encounter for screening mammogram for malignant neoplasm of breast: Secondary | ICD-10-CM

## 2017-03-17 DIAGNOSIS — Z1211 Encounter for screening for malignant neoplasm of colon: Secondary | ICD-10-CM | POA: Diagnosis not present

## 2017-03-17 DIAGNOSIS — Z01419 Encounter for gynecological examination (general) (routine) without abnormal findings: Secondary | ICD-10-CM | POA: Diagnosis not present

## 2017-03-17 DIAGNOSIS — N3281 Overactive bladder: Secondary | ICD-10-CM | POA: Diagnosis not present

## 2017-03-25 DIAGNOSIS — Z79899 Other long term (current) drug therapy: Secondary | ICD-10-CM | POA: Diagnosis not present

## 2017-03-25 DIAGNOSIS — M5116 Intervertebral disc disorders with radiculopathy, lumbar region: Secondary | ICD-10-CM | POA: Diagnosis not present

## 2017-04-01 DIAGNOSIS — M519 Unspecified thoracic, thoracolumbar and lumbosacral intervertebral disc disorder: Secondary | ICD-10-CM | POA: Insufficient documentation

## 2017-04-01 DIAGNOSIS — E041 Nontoxic single thyroid nodule: Secondary | ICD-10-CM | POA: Diagnosis not present

## 2017-04-01 DIAGNOSIS — Z0000 Encounter for general adult medical examination without abnormal findings: Secondary | ICD-10-CM | POA: Diagnosis not present

## 2017-04-01 DIAGNOSIS — Z800 Family history of malignant neoplasm of digestive organs: Secondary | ICD-10-CM | POA: Insufficient documentation

## 2017-04-01 DIAGNOSIS — D369 Benign neoplasm, unspecified site: Secondary | ICD-10-CM | POA: Insufficient documentation

## 2017-04-01 DIAGNOSIS — K219 Gastro-esophageal reflux disease without esophagitis: Secondary | ICD-10-CM | POA: Diagnosis not present

## 2017-04-06 DIAGNOSIS — E041 Nontoxic single thyroid nodule: Secondary | ICD-10-CM | POA: Diagnosis not present

## 2017-04-16 DIAGNOSIS — R490 Dysphonia: Secondary | ICD-10-CM | POA: Diagnosis not present

## 2017-04-16 DIAGNOSIS — J381 Polyp of vocal cord and larynx: Secondary | ICD-10-CM | POA: Diagnosis not present

## 2017-04-16 DIAGNOSIS — K219 Gastro-esophageal reflux disease without esophagitis: Secondary | ICD-10-CM | POA: Diagnosis not present

## 2017-04-16 DIAGNOSIS — J301 Allergic rhinitis due to pollen: Secondary | ICD-10-CM | POA: Diagnosis not present

## 2017-04-27 ENCOUNTER — Other Ambulatory Visit: Payer: Self-pay | Admitting: Obstetrics and Gynecology

## 2017-04-27 ENCOUNTER — Ambulatory Visit
Admission: RE | Admit: 2017-04-27 | Discharge: 2017-04-27 | Disposition: A | Discharge status: Home / Self Care | Payer: 59 | Source: Ambulatory Visit | Attending: Obstetrics and Gynecology | Admitting: Obstetrics and Gynecology

## 2017-04-27 DIAGNOSIS — Z1231 Encounter for screening mammogram for malignant neoplasm of breast: Secondary | ICD-10-CM | POA: Diagnosis present

## 2017-05-15 ENCOUNTER — Ambulatory Visit: Payer: 59 | Attending: Otolaryngology | Admitting: Speech Pathology

## 2017-05-15 ENCOUNTER — Encounter: Payer: Self-pay | Admitting: Speech Pathology

## 2017-05-15 DIAGNOSIS — R490 Dysphonia: Secondary | ICD-10-CM | POA: Diagnosis not present

## 2017-05-15 NOTE — Therapy (Signed)
[place] MAIN [facility] SERVICES [address] [place], [place], [address] Phone: [phone]   Fax:  [phone]  Speech Language Pathology Evaluation  Patient Details  Name: Carolyn Cunningham MRN: [id number] Date of Birth: 05-17-1951 Referring Provider: Dr. Pryor Ochoa  Encounter Date: 05/15/2017      End of Session - 05/15/17 1620    Visit Number 1   Number of Visits 17   Date for SLP Re-Evaluation 07/17/17   SLP Start Time 8546   SLP Stop Time  1530   SLP Time Calculation (min) 45 min   Activity Tolerance Patient tolerated treatment well      History reviewed. No pertinent past medical history.  Past Surgical History:  Procedure Laterality Date   BLADDER SURGERY     BREAST BIOPSY Left 1992   EXCISIONAL - NEG   BREAST SURGERY      There were no vitals filed for this visit.          SLP Evaluation [facility] - 05/15/17 0001      SLP Visit Information   SLP Received On 05/15/17   Referring Provider Dr. Pryor Ochoa   Onset Date 04/16/2017   Medical Diagnosis Dysphonia     Subjective   Subjective "I sound bad"   Patient/Family Stated Goal clear vocal quality     General Information   HPI 66 year old woman, with 17 year history of hoarseness, referred by Dr. Pryor Ochoa for voice therapy.  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     Prior Functional Status   Cognitive/Linguistic Baseline Within functional limits     Oral Motor/Sensory Function   Overall Oral Motor/Sensory Function Appears within functional limits for tasks assessed     Motor Speech   Overall Motor Speech Impaired   Respiration Impaired   Level of Impairment Conversation   Phonation Breathy;Hoarse;Low vocal intensity   Resonance Within functional limits   Articulation Within functional limitis   Intelligibility Intelligible   Phonation Impaired   Vocal Abuses  Habitual Cough/Throat Clear;Prolonged Vocal Use   Tension Present Jaw;Neck;Shoulder   Volume Soft   Pitch Appropriate     Standardized Assessments   Standardized Assessments  Other Assessment  Perceptual Voice Evaluation      Perceptual Voice Evaluation Voice checklist:  Health risks: GERD, allergies   Characteristic voice use: patient states she talks and sings a lot   Environmental risks: no significant environmental risks  Misuse: excessive talking/singing  Abuse: some coughing/throat clearing  Vocal characteristics: breathy, hoarse, limited voice range, poor vocal projection, excessive pharyngeal resonance Patient quality of life survey: VHI-10: 16 (A score of 10 or higher indicate voice handicap) Maximum phonation time for sustained ah: 15 seconds Average fundamental frequency during sustained ah: 215 Hz Highest dynamic pitch when altering pitch from a low note to a high note: 530 Hz Lowest dynamic pitch when altering from a high note to a low note: 174 Hz Highest dynamic pitch in conversational speech: 241 Hz Lowest dynamic pitch in conversational speech: 184 Hz Average time patient was able to sustain /s/: 13.7 seconds Average time patient was able to sustain /z/: 19 seconds s/z ratio: 0.7 Visi-Pitch: Multi-Dimensional Voice Program (MDVP)  MDVP extracts objective quantitative values (Relative Average Perturbation, Shimmer, Voice Turbulence Index, and Noise to Harmonic Ratio) on sustained phonation, which are displayed graphically and numerically in comparison to a built-in normative database.  The patient exhibited values  within the norm for Relative Average Perturbation.  Average fundamental frequency was average for age and gender. Perceptually, her voice was muffled.       SLP Education - 05/15/17 1620    Education provided Yes   Education Details voice therapy   Person(s) Educated Patient   Methods Explanation   Comprehension Verbalized understanding             SLP Long Term Goals - 05/15/17 1623      SLP LONG TERM GOAL #1   Title The patient will demonstrate independent understanding of vocal hygiene concepts and extrinsic laryngeal muscle stretches.     Time 8   Period Weeks   Status New   Target Date 07/17/17     SLP LONG TERM GOAL #2   Title The patient will be independent for abdominal breathing and breath support exercises.   Time 8   Period Weeks   Status New   Target Date 07/17/17     SLP LONG TERM GOAL #3   Title The patient will minimize vocal tension via resonant voice therapy (or comparable technique) with min SLP cues with 80% accuracy.   Time 8   Period Weeks   Status New   Target Date 07/17/17     SLP LONG TERM GOAL #4   Title The patient will maintain relaxed phonation / oral resonance for paragraph length recitation with 80% accuracy.   Time 8   Period Weeks   Target Date 07/17/17          Plan - 05/15/17 1622    Clinical Impression Statement This 66 year old woman under the care of Dr. Pryor Ochoa, with muscle tension dysphonia, vocal fold polyp, and laryngeal edema, is presenting with moderate dysphonia.  The patient demonstrates hoarse vocal quality, reduced breath control for speech, strained/tense phonation, limited pitch range, vocal fatigue, and laryngeal tension. She will benefit from voice therapy for education, to improve breath support, improve tone focus, promote easy flow phonation, and learn techniques to increase loudness and pitch range without strain.   Speech Therapy Frequency 2x / week   Duration Other (comment)  8 weeks   Potential to Achieve Goals Good   Potential Considerations Ability to learn/carryover information;Co-morbidities;Cooperation/participation level;Medical prognosis;Pain level;Previous level of function;Severity of impairments;Family/community support   SLP Home Exercise Plan TBD   Consulted and Agree with Plan of Care Patient      Patient will benefit from skilled  therapeutic intervention in order to improve the following deficits and impairments:   Dysphonia - Plan: SLP plan of care cert/re-cert    Problem List There are no active problems to display for this patient.  Leroy Sea, MS/CCC- SLP  Lou Miner 05/15/2017, 4:27 PM  [place] MAIN [facility] SERVICES [address] [place], [place], [address] Phone: [phone]   Fax:  [phone]  Name: GLENNDA WEATHERHOLTZ MRN: [id number] Date of Birth: 06/20/1951

## 2017-05-22 ENCOUNTER — Ambulatory Visit: Payer: Commercial Managed Care - HMO | Attending: Otolaryngology | Admitting: Speech Pathology

## 2017-05-22 ENCOUNTER — Encounter: Payer: Self-pay | Admitting: Speech Pathology

## 2017-05-22 DIAGNOSIS — R490 Dysphonia: Secondary | ICD-10-CM | POA: Diagnosis not present

## 2017-05-22 NOTE — Therapy (Signed)
[place] MAIN [facility] SERVICES [address] [place], [place], [address] Phone: [phone]   Fax:  [phone]  Speech Language Pathology Treatment  Patient Details  Name: Carolyn Cunningham MRN: [id number] Date of Birth: 02-Feb-1951 Referring Provider: Dr. Pryor Ochoa  Encounter Date: 05/22/2017      End of Session - 05/22/17 1613    Visit Number 2   Number of Visits 17   Date for SLP Re-Evaluation 07/17/17   SLP Start Time 1500   SLP Stop Time  1600   SLP Time Calculation (min) 60 min   Activity Tolerance Patient tolerated treatment well      History reviewed. No pertinent past medical history.  Past Surgical History:  Procedure Laterality Date  . BLADDER SURGERY    . BREAST BIOPSY Left 1992   EXCISIONAL - NEG  . BREAST SURGERY      There were no vitals filed for this visit.      Subjective Assessment - 05/22/17 1605    Subjective Pt was pleasant and cooperative. Reports that her voice is improving since she has started on reflux medication.   Currently in Pain? No/denies               ADULT SLP TREATMENT - 05/22/17 0001      General Information   Behavior/Cognition Alert;Cooperative;Pleasant mood   Patient Positioning Upright in chair   Oral care provided N/A   HPI 66 y/o woman, with a 17 yr history of hoarseness. Per report, abnormal laryngeal findings include: erythema, edema, coblestoning, polyp on left middle true vocal fold, and interarytnoid pachydermia     Treatment Provided   Treatment provided Cognitive-Linquistic     Pain Assessment   Pain Assessment No/denies pain     Cognitive-Linquistic Treatment   Treatment focused on Voice   Skilled Treatment The patient was provided written and verbal education on vocal hygiene concepts and extrinsic laryngeal muscle stretches. Pt provided with written, verbal and visual teaching of abdominal breathing. Pt able to demonstrate abdominal breathing in 3 out 5 attempts  with moderate cues. Pt produced '"s" with good abdominal breath in 3/4 attempts with moderate cues. Pt educated on resonant voicing and produced "hum" with reduced vocal tension in 1/3 attempts.      Assessment / Recommendations / Plan   Plan Continue with current plan of care     Progression Toward Goals   Progression toward goals Progressing toward goals          SLP Education - 05/22/17 1612    Education provided Yes   Education Details Vocal hygiene, abdominal breathing, voice therapy   Person(s) Educated Patient   Methods Explanation;Demonstration;Verbal cues;Handout   Comprehension Verbalized understanding            SLP Long Term Goals - 05/15/17 1623      SLP LONG TERM GOAL #1   Title The patient will demonstrate independent understanding of vocal hygiene concepts and extrinsic laryngeal muscle stretches.     Time 8   Period Weeks   Status New   Target Date 07/17/17     SLP LONG TERM GOAL #2   Title The patient will be independent for abdominal breathing and breath support exercises.   Time 8   Period Weeks   Status New   Target Date 07/17/17     SLP LONG TERM GOAL #3   Title The patient will minimize vocal tension via resonant voice therapy (or comparable technique) with min  SLP cues with 80% accuracy.   Time 8   Period Weeks   Status New   Target Date 07/17/17     SLP LONG TERM GOAL #4   Title The patient will maintain relaxed phonation / oral resonance for paragraph length recitation with 80% accuracy.   Time 8   Period Weeks   Target Date 07/17/17          Plan - 05/22/17 1615    Clinical Impression Statement Ms. Shanker demonstrated good comprehension of vocal hygiene as well as ways to reduce vocally abusive behaviors. She was able to demonstrate abdominal breathing with moderate cues and able to produce relaxed phonation with "hum" in 1 of 3 attempts. Will continue voice therapy for education, breath support, and promoting relaxed phonation.    Speech Therapy Frequency 2x / week   Duration Other (comment)  8 weeks   Potential to Achieve Goals Good   Potential Considerations Ability to learn/carryover information;Co-morbidities;Cooperation/participation level;Medical prognosis;Pain level;Previous level of function;Severity of impairments;Family/community support   SLP Home Exercise Plan TBD   Consulted and Agree with Plan of Care Patient      Patient will benefit from skilled therapeutic intervention in order to improve the following deficits and impairments:   Dysphonia    Problem List There are no active problems to display for this patient.  Charlean Sanfilippo, [place], CCC-SLP  Speech-Language Pathologist   [place] 05/22/2017, 4:17 PM  [place] MAIN [facility] SERVICES [address] [place], [place], [address] Phone: [phone]   Fax:  [phone]   Name: Carolyn Cunningham MRN: [id number] Date of Birth: 1951/06/16

## 2017-05-27 DIAGNOSIS — E042 Nontoxic multinodular goiter: Secondary | ICD-10-CM | POA: Diagnosis not present

## 2017-05-29 ENCOUNTER — Encounter: Payer: Self-pay | Admitting: Speech Pathology

## 2017-05-29 ENCOUNTER — Ambulatory Visit: Payer: Commercial Managed Care - HMO | Admitting: Speech Pathology

## 2017-05-29 ENCOUNTER — Other Ambulatory Visit: Payer: Self-pay

## 2017-05-29 DIAGNOSIS — R490 Dysphonia: Secondary | ICD-10-CM | POA: Diagnosis not present

## 2017-05-29 NOTE — Therapy (Signed)
[place] MAIN [facility] SERVICES [address] [place], [place], [address] Phone: [phone]   Fax:  [phone]  Speech Language Pathology Treatment  Patient Details  Name: Carolyn Cunningham MRN: [id number] Date of Birth: [birth year] Referring Provider: Dr. Pryor Ochoa   Encounter Date: 05/29/2017  End of Session - 05/29/17 1458    Visit Number  3    Number of Visits  17    Date for SLP Re-Evaluation  07/17/17    SLP Start Time  1400    SLP Stop Time   6226    SLP Time Calculation (min)  45 min    Activity Tolerance  Patient tolerated treatment well       History reviewed. No pertinent past medical history.  Past Surgical History:  Procedure Laterality Date  . BLADDER SURGERY    . BREAST BIOPSY Left 1992   EXCISIONAL - NEG  . BREAST SURGERY      There were no vitals filed for this visit.  Subjective Assessment - 05/29/17 1458    Subjective  Pt was pleasant and cooperative. Reports that her voice is improving since she has started on reflux medication.    Currently in Pain?  No/denies            ADULT SLP TREATMENT - 05/29/17 0001      General Information   Behavior/Cognition  Alert;Cooperative;Pleasant mood    HPI  66 y/o woman, with a 17 yr history of hoarseness. Per report, abnormal laryngeal findings include: erythema, edema, coblestoning, polyp on left middle true vocal fold, and interarytnoid pachydermia      Treatment Provided   Treatment provided  Cognitive-Linquistic      Pain Assessment   Pain Assessment  No/denies pain      Cognitive-Linquistic Treatment   Treatment focused on  Voice    Skilled Treatment  The patient was provided with written and verbal teaching regarding vocal hygiene.  The patient was provided with written and verbal teaching regarding neck, shoulder, tongue, and throat stretches exercises to promote relaxed phonation. The patient was provided with written and verbal teaching regarding breath  support exercises.  Patient instructed in relaxed phonation / oral resonance. The patient responded well to resonant voice therapy, specifically the /m/syllables.  She can achieve clear vocal quality with /m/ syllables, initial /m/ words, and initial /m/ phrases.  She stated that she could "feel" when it was correct.  Occasional generalization of clear vocal quality/oral resonance in short spontaneous responses and in conversation.        Assessment / Recommendations / Plan   Plan  Continue with current plan of care      Progression Toward Goals   Progression toward goals  Progressing toward goals       SLP Education - 05/29/17 1458    Education provided  Yes    Education Details  resonant voice    Person(s) Educated  Patient    Methods  Explanation    Comprehension  Verbalized understanding         SLP Long Term Goals - 05/15/17 1623      SLP LONG TERM GOAL #1   Title  The patient will demonstrate independent understanding of vocal hygiene concepts and extrinsic laryngeal muscle stretches.      Time  8    Period  Weeks    Status  New    Target Date  07/17/17      SLP LONG TERM GOAL #2  Title  The patient will be independent for abdominal breathing and breath support exercises.    Time  8    Period  Weeks    Status  New    Target Date  07/17/17      SLP LONG TERM GOAL #3   Title  The patient will minimize vocal tension via resonant voice therapy (or comparable technique) with min SLP cues with 80% accuracy.    Time  8    Period  Weeks    Status  New    Target Date  07/17/17      SLP LONG TERM GOAL #4   Title  The patient will maintain relaxed phonation / oral resonance for paragraph length recitation with 80% accuracy.    Time  8    Period  Weeks    Target Date  07/17/17       Plan - 05/29/17 1459    Clinical Impression Statement  The patient is able to improve vocal quality with resonant voice and vocal loudness to improve oral resonance and decrease laryngeal  strain.     Speech Therapy Frequency  2x / week    Duration  Other (comment)    Potential to Achieve Goals  Good    Potential Considerations  Ability to learn/carryover information;Co-morbidities;Cooperation/participation level;Medical prognosis;Pain level;Previous level of function;Severity of impairments;Family/community support    SLP Home Exercise Plan  neck, shoulder, tongue, and throat stretches; breath support exercises; relaxed phonation / oral resonance; resonant voice exercises      Consulted and Agree with Plan of Care  Patient       Patient will benefit from skilled therapeutic intervention in order to improve the following deficits and impairments:   Dysphonia    Problem List There are no active problems to display for this patient.  Leroy Sea, MS/CCC- SLP  Lou Miner 05/29/2017, 3:00 PM  [place] MAIN [facility] SERVICES [address] [place], [place], [address] Phone: [phone]   Fax:  [phone]   Name: Carolyn Cunningham MRN: [id number] Date of Birth: 1951-06-11

## 2017-06-05 ENCOUNTER — Ambulatory Visit: Payer: Commercial Managed Care - HMO | Admitting: Speech Pathology

## 2017-06-05 ENCOUNTER — Encounter: Payer: Self-pay | Admitting: Speech Pathology

## 2017-06-05 ENCOUNTER — Other Ambulatory Visit: Payer: Self-pay

## 2017-06-05 DIAGNOSIS — R490 Dysphonia: Secondary | ICD-10-CM

## 2017-06-05 NOTE — Therapy (Signed)
[place] MAIN [facility] SERVICES [address] [place], [place], [address] Phone: [phone]   Fax:  [phone]  Speech Language Pathology Treatment  Patient Details  Name: Carolyn Cunningham MRN: [id number] Date of Birth: 1951/01/07 Referring Provider: Dr. Pryor Ochoa   Encounter Date: 06/05/2017  End of Session - 06/05/17 1453    Visit Number  4    Number of Visits  17    Date for SLP Re-Evaluation  07/17/17    SLP Start Time  2229    SLP Stop Time   1443    SLP Time Calculation (min)  50 min    Activity Tolerance  Patient tolerated treatment well       History reviewed. No pertinent past medical history.  Past Surgical History:  Procedure Laterality Date  . BLADDER SURGERY    . BREAST BIOPSY Left 1992   EXCISIONAL - NEG  . BREAST SURGERY      There were no vitals filed for this visit.  Subjective Assessment - 06/05/17 1453    Subjective  "I've been trying to use a clear voice"    Currently in Pain?  No/denies            ADULT SLP TREATMENT - 06/05/17 0001      General Information   Behavior/Cognition  Alert;Cooperative;Pleasant mood    HPI  66 y/o woman, with a 17 yr history of hoarseness. Per report, abnormal laryngeal findings include: erythema, edema, coblestoning, polyp on left middle true vocal fold, and interarytnoid pachydermia      Treatment Provided   Treatment provided  Cognitive-Linquistic      Pain Assessment   Pain Assessment  No/denies pain      Cognitive-Linquistic Treatment   Treatment focused on  Voice    Skilled Treatment  The patient was provided with written and verbal teaching regarding vocal hygiene.  The patient was provided with written and verbal teaching regarding neck, shoulder, tongue, and throat stretches to promote relaxed phonation. The patient was provided with written and verbal teaching regarding breath support exercises.  Patient instructed in relaxed phonation / oral resonance. The  patient responded well to resonant voice therapy, specifically the /m/syllables.  She can achieve clear vocal quality with hummed pitch glides and initial /m/ phrases.  Patient maintained clear vocal quality X40 minutes in structured conversation with 70% accuracy.      Assessment / Recommendations / Plan   Plan  Continue with current plan of care      Progression Toward Goals   Progression toward goals  Progressing toward goals       SLP Education - 06/05/17 1453    Education provided  Yes    Education Details  resonant voice    Person(s) Educated  Patient    Methods  Explanation    Comprehension  Verbalized understanding         SLP Long Term Goals - 05/15/17 1623      SLP LONG TERM GOAL #1   Title  The patient will demonstrate independent understanding of vocal hygiene concepts and extrinsic laryngeal muscle stretches.      Time  8    Period  Weeks    Status  New    Target Date  07/17/17      SLP LONG TERM GOAL #2   Title  The patient will be independent for abdominal breathing and breath support exercises.    Time  8    Period  Weeks  Status  New    Target Date  07/17/17      SLP LONG TERM GOAL #3   Title  The patient will minimize vocal tension via resonant voice therapy (or comparable technique) with min SLP cues with 80% accuracy.    Time  8    Period  Weeks    Status  New    Target Date  07/17/17      SLP LONG TERM GOAL #4   Title  The patient will maintain relaxed phonation / oral resonance for paragraph length recitation with 80% accuracy.    Time  8    Period  Weeks    Target Date  07/17/17       Plan - 06/05/17 1454    Clinical Impression Statement  The patient is able to improve vocal quality with resonant voice and vocal loudness to improve oral resonance and decrease laryngeal strain.  She demonstrates generalization to conversational speech.      Speech Therapy Frequency  2x / week    Duration  Other (comment)    Treatment/Interventions  SLP  instruction and feedback;Patient/family education;Other (comment) Voice therapy    Potential to Achieve Goals  Good    Potential Considerations  Ability to learn/carryover information;Co-morbidities;Cooperation/participation level;Medical prognosis;Pain level;Previous level of function;Severity of impairments;Family/community support    SLP Home Exercise Plan  neck, shoulder, tongue, and throat stretches; breath support exercises; relaxed phonation / oral resonance; resonant voice exercises      Consulted and Agree with Plan of Care  Patient       Patient will benefit from skilled therapeutic intervention in order to improve the following deficits and impairments:   Dysphonia    Problem List There are no active problems to display for this patient.  Leroy Sea, MS/CCC- SLP  Lou Miner 06/05/2017, 2:55 PM  [place] MAIN [facility] SERVICES [address] [place], [place], [address] Phone: [phone]   Fax:  [phone]   Name: Carolyn Cunningham MRN: [id number] Date of Birth: 1951/05/15

## 2017-06-08 ENCOUNTER — Other Ambulatory Visit: Payer: Self-pay

## 2017-06-08 ENCOUNTER — Ambulatory Visit: Payer: Commercial Managed Care - HMO | Admitting: Speech Pathology

## 2017-06-08 ENCOUNTER — Encounter: Payer: Self-pay | Admitting: Speech Pathology

## 2017-06-08 DIAGNOSIS — R490 Dysphonia: Secondary | ICD-10-CM

## 2017-06-08 NOTE — Therapy (Signed)
[place] MAIN [facility] SERVICES [address] [place], [place], [address] Phone: [phone]   Fax:  [phone]  Speech Language Pathology Treatment/Discharge Summary  Patient Details  Name: DANYALE RIDINGER MRN: [id number] Date of Birth: 1951/02/02 Referring Provider: Dr. Pryor Ochoa   Encounter Date: 06/08/2017  End of Session - 06/08/17 1533    Visit Number  5    Number of Visits  17    Date for SLP Re-Evaluation  07/17/17    SLP Start Time  1450    SLP Stop Time   1534    SLP Time Calculation (min)  44 min    Activity Tolerance  Patient tolerated treatment well       History reviewed. No pertinent past medical history.  Past Surgical History:  Procedure Laterality Date  . BLADDER SURGERY    . BREAST BIOPSY Left 1992   EXCISIONAL - NEG  . BREAST SURGERY      There were no vitals filed for this visit.  Subjective Assessment - 06/08/17 1532    Subjective  The patient states that she is ready for discharge- she is confident that she will successfully continue to improve her voice    Currently in Pain?  No/denies            ADULT SLP TREATMENT - 06/08/17 0001      General Information   Behavior/Cognition  Alert;Cooperative;Pleasant mood    HPI  66 y/o woman, with a 17 yr history of hoarseness. Per report, abnormal laryngeal findings include: erythema, edema, coblestoning, polyp on left middle true vocal fold, and interarytnoid pachydermia      Treatment Provided   Treatment provided  Cognitive-Linquistic      Pain Assessment   Pain Assessment  No/denies pain      Cognitive-Linquistic Treatment   Treatment focused on  Voice    Skilled Treatment  The patient was provided with written and verbal teaching regarding vocal hygiene.  The patient was provided with written and verbal teaching regarding neck, shoulder, tongue, and throat stretches to promote relaxed phonation. The patient was provided with written and verbal teaching  regarding breath support exercises.  Patient instructed in relaxed phonation / oral resonance. The patient responded well to resonant voice therapy, specifically the /m/syllables.  She can achieve clear vocal quality with hummed pitch glides and initial /m/ phrases.  Patient maintained clear vocal quality X40 minutes in structured conversation with 85% accuracy.      Assessment / Recommendations / Plan   Plan  Continue with current plan of care      Progression Toward Goals   Progression toward goals  Progressing toward goals       SLP Education - 06/08/17 1533    Education Details  resonant voice    Person(s) Educated  Patient    Methods  Explanation    Comprehension  Verbalized understanding         SLP Long Term Goals - 06/08/17 1535      SLP LONG TERM GOAL #1   Title  The patient will demonstrate independent understanding of vocal hygiene concepts and extrinsic laryngeal muscle stretches.      Status  Achieved      SLP LONG TERM GOAL #2   Title  The patient will be independent for abdominal breathing and breath support exercises.    Status  Achieved      SLP LONG TERM GOAL #3   Title  The patient will minimize  vocal tension via resonant voice therapy (or comparable technique) with min SLP cues with 80% accuracy.    Status  Achieved      SLP LONG TERM GOAL #4   Title  The patient will maintain relaxed phonation / oral resonance for paragraph length recitation with 80% accuracy.    Status  Achieved       Plan - 06/08/17 1534    Clinical Impression Statement  The patient has met her voice therapy goals and states she is ready for discharge.  The patient is able to improve vocal quality with resonant voice and vocal loudness to improve oral resonance and decrease laryngeal strain.  She is consistently aware of errors and is able to repair vocal quality.  She demonstrates generalization to conversational speech.      Speech Therapy Frequency  Other (comment) Discharge     Treatment/Interventions  SLP instruction and feedback;Patient/family education;Other (comment) Voice therapy    Potential to Achieve Goals  Good    Potential Considerations  Ability to learn/carryover information;Co-morbidities;Cooperation/participation level;Medical prognosis;Pain level;Previous level of function;Severity of impairments;Family/community support    SLP Home Exercise Plan  neck, shoulder, tongue, and throat stretches; breath support exercises; relaxed phonation / oral resonance; resonant voice exercises      Consulted and Agree with Plan of Care  Patient       Patient will benefit from skilled therapeutic intervention in order to improve the following deficits and impairments:   Dysphonia    Problem List There are no active problems to display for this patient.  Leroy Sea, MS/CCC- SLP  Lou Miner 06/08/2017, 3:36 PM  [place] MAIN [facility] SERVICES [address] [place], [place], [address] Phone: [phone]   Fax:  [phone]   Name: MARLENA BARBATO MRN: [id number] Date of Birth: 1951-04-18

## 2017-06-17 ENCOUNTER — Ambulatory Visit: Payer: Self-pay | Admitting: Speech Pathology

## 2017-06-17 DIAGNOSIS — L578 Other skin changes due to chronic exposure to nonionizing radiation: Secondary | ICD-10-CM | POA: Diagnosis not present

## 2017-06-17 DIAGNOSIS — L570 Actinic keratosis: Secondary | ICD-10-CM | POA: Diagnosis not present

## 2017-06-17 DIAGNOSIS — Z85828 Personal history of other malignant neoplasm of skin: Secondary | ICD-10-CM | POA: Diagnosis not present

## 2017-06-19 ENCOUNTER — Ambulatory Visit: Payer: Self-pay | Admitting: Speech Pathology

## 2017-06-26 DIAGNOSIS — Z7689 Persons encountering health services in other specified circumstances: Secondary | ICD-10-CM | POA: Diagnosis not present

## 2017-06-26 DIAGNOSIS — E042 Nontoxic multinodular goiter: Secondary | ICD-10-CM | POA: Diagnosis not present

## 2017-06-26 DIAGNOSIS — E041 Nontoxic single thyroid nodule: Secondary | ICD-10-CM | POA: Diagnosis not present

## 2017-08-15 DIAGNOSIS — J0100 Acute maxillary sinusitis, unspecified: Secondary | ICD-10-CM | POA: Diagnosis not present

## 2017-11-22 ENCOUNTER — Emergency Department (HOSPITAL_COMMUNITY)
Admission: EM | Admit: 2017-11-22 | Discharge: 2017-11-22 | Disposition: A | Discharge status: Home / Self Care | Payer: 59 | Attending: Emergency Medicine | Admitting: Emergency Medicine

## 2017-11-22 ENCOUNTER — Encounter (HOSPITAL_COMMUNITY): Payer: Self-pay | Admitting: Emergency Medicine

## 2017-11-22 DIAGNOSIS — R21 Rash and other nonspecific skin eruption: Secondary | ICD-10-CM | POA: Diagnosis present

## 2017-11-22 DIAGNOSIS — Z79899 Other long term (current) drug therapy: Secondary | ICD-10-CM | POA: Diagnosis not present

## 2017-11-22 DIAGNOSIS — B029 Zoster without complications: Secondary | ICD-10-CM

## 2017-11-22 MED ORDER — PREDNISONE 10 MG PO TABS: 20.0000 mg | ORAL_TABLET | Freq: Every day | ORAL | 0 refills | Status: DC

## 2017-11-22 MED ORDER — VALACYCLOVIR HCL 1 G PO TABS: 1000.0000 mg | ORAL_TABLET | Freq: Three times a day (TID) | ORAL | 0 refills | Status: AC

## 2017-11-22 NOTE — ED Triage Notes (Signed)
Pt states she woke up with a few burning/itching patches around her left eye.  Was concerned it could be shingles and wanted to be seen.

## 2017-11-22 NOTE — ED Notes (Signed)
Pt noted to have red area between are on left forehead. Pt reports area left upper eye lid was present when she woke up. Reports areas burn and itch

## 2017-11-22 NOTE — Discharge Instructions (Signed)
I am concerned that she had shingles.  Prescription for prednisone and antiviral medication.  Follow-up with your eye doctor if there is any eye involvement including blurred vision, decreased vision, lesions on your eyeball, any concerns

## 2017-11-23 NOTE — ED Provider Notes (Signed)
[facility] EMERGENCY DEPARTMENT Provider Note   CSN: [id number] Arrival date & time: 11/22/17  0645     History   Chief Complaint Chief Complaint  Patient presents with  . Rash    HPI Carolyn Cunningham is a 67 y.o. female.  Rash on central and left forehead with radiation to the medial aspect of the left eye for 24 hours.  No fever, sweats, chills.  She states she has had the shingles vaccine.  Otherwise she is feeling well.  She is tried nothing for her symptoms.  Severity is mild.     History reviewed. No pertinent past medical history.  There are no active problems to display for this patient.   Past Surgical History:  Procedure Laterality Date  . BLADDER SURGERY    . BREAST BIOPSY Left 1992   EXCISIONAL - NEG  . BREAST SURGERY       OB History   None      Home Medications    Prior to Admission medications   Medication Sig Start Date End Date Taking? Authorizing Provider  BIOTIN PO Take 1 tablet by mouth daily.    [provider]  Calcium Carbonate-Vitamin D (CALCIUM + D PO) Take 1 tablet by mouth daily.    [provider]  Cholecalciferol (VITAMIN D PO) Take 1 tablet by mouth daily.    [provider]  fesoterodine (TOVIAZ) 4 MG TB24 tablet Take 4 mg by mouth daily.    [provider]  fish oil-omega-3 fatty acids 1000 MG capsule Take 1 g by mouth daily.    [provider]  HYDROcodone-acetaminophen (NORCO/VICODIN) 5-325 MG per tablet Take 0.5-2 tablets by mouth every 6 (six) hours as needed for pain.    [provider]  MAGNESIUM PO Take by mouth.    [provider]  Misc Natural Products (OSTEO BI-FLEX ADV JOINT SHIELD PO) Take 1 tablet by mouth daily.    [provider]  Multiple Vitamins-Minerals (ZINC PO) Take 1 tablet by mouth daily.    [provider]  nitrofurantoin, macrocrystal-monohydrate, (MACROBID) 100 MG capsule Take 100 mg by mouth 2 (two) times daily. 7 day course  beginning on 04/27/13. Recent increase from one capsule daily to two capsules as directed per MD    [provider]  POTASSIUM PO Take 1 tablet by mouth daily.    [provider]  predniSONE (DELTASONE) 10 MG tablet Take 2 tablets (20 mg total) by mouth daily. 11/22/17   Nat Christen, MD  Pyridoxine HCl (B-6 PO) Take 1 tablet by mouth daily.    [provider]  valACYclovir (VALTREX) 1000 MG tablet Take 1 tablet (1,000 mg total) by mouth 3 (three) times daily for 14 days. 11/22/17 12/06/17  Nat Christen, MD    Family History Family History  Problem Relation Age of Onset  . Breast cancer Neg Hx     Social History Social History   Tobacco Use  . Smoking status: Never Smoker  . Smokeless tobacco: Never Used  Substance Use Topics  . Alcohol use: No  . Drug use: No     Allergies   Sulfa antibiotics   Review of Systems Review of Systems  All other systems reviewed and are negative.    Physical Exam Updated Vital Signs BP (!) 160/71 (BP Location: Right Arm)   Pulse (!) 56   Temp 97.9 F (36.6 C) (Oral)   Resp 16   Ht 5\' 4"  (1.626 m)  Wt 69.4 kg (153 lb)   SpO2 100%   BMI 26.26 kg/m   Physical Exam  Constitutional: She is oriented to person, place, and time. She appears well-developed and well-nourished.  HENT:  Head: Normocephalic and atraumatic.  Eyes: Conjunctivae are normal.  No eye involvement  Neck: Neck supple.  Musculoskeletal: Normal range of motion.  Neurological: She is alert and oriented to person, place, and time.  Skin:  Facial exam: Clear vesicles and ulcerations in her forehead and the soft tissue of the medial aspect of the left eye.    Psychiatric: She has a normal mood and affect. Her behavior is normal.  Nursing note and vitals reviewed.    ED Treatments / Results  Labs (all labs ordered are listed, but only abnormal results are displayed) Labs Reviewed - No data to display  EKG None  Radiology No results  found.  Procedures Procedures (including critical care time)  Medications Ordered in ED Medications - No data to display   Initial Impression / Assessment and Plan / ED Course  I have reviewed the triage vital signs and the nursing notes.  Pertinent labs & imaging results that were available during my care of the patient were reviewed by me and considered in my medical decision making (see chart for details).     History and physical most consistent with shingles.  Will Rx valacyclovir and prednisone.  She has primary care follow-up.  Final Clinical Impressions(s) / ED Diagnoses   Final diagnoses:  Herpes zoster without complication    ED Discharge Orders        Ordered    valACYclovir (VALTREX) 1000 MG tablet  3 times daily     11/22/17 0747    predniSONE (DELTASONE) 10 MG tablet  Daily     11/22/17 0747       Nat Christen, MD 11/23/17 1034

## 2017-11-24 DIAGNOSIS — B029 Zoster without complications: Secondary | ICD-10-CM | POA: Diagnosis not present

## 2017-11-24 DIAGNOSIS — H2513 Age-related nuclear cataract, bilateral: Secondary | ICD-10-CM | POA: Diagnosis not present

## 2017-12-02 DIAGNOSIS — B029 Zoster without complications: Secondary | ICD-10-CM | POA: Diagnosis not present

## 2017-12-02 DIAGNOSIS — H2513 Age-related nuclear cataract, bilateral: Secondary | ICD-10-CM | POA: Diagnosis not present

## 2017-12-17 DIAGNOSIS — L820 Inflamed seborrheic keratosis: Secondary | ICD-10-CM | POA: Diagnosis not present

## 2017-12-17 DIAGNOSIS — Z85828 Personal history of other malignant neoplasm of skin: Secondary | ICD-10-CM | POA: Diagnosis not present

## 2017-12-17 DIAGNOSIS — D2230 Melanocytic nevi of unspecified part of face: Secondary | ICD-10-CM | POA: Diagnosis not present

## 2017-12-17 DIAGNOSIS — Z1283 Encounter for screening for malignant neoplasm of skin: Secondary | ICD-10-CM | POA: Diagnosis not present

## 2017-12-31 DIAGNOSIS — H0012 Chalazion right lower eyelid: Secondary | ICD-10-CM | POA: Diagnosis not present

## 2018-01-06 DIAGNOSIS — E042 Nontoxic multinodular goiter: Secondary | ICD-10-CM | POA: Diagnosis not present

## 2018-01-13 DIAGNOSIS — E042 Nontoxic multinodular goiter: Secondary | ICD-10-CM | POA: Diagnosis not present

## 2018-01-18 DIAGNOSIS — B029 Zoster without complications: Secondary | ICD-10-CM | POA: Diagnosis not present

## 2018-01-29 DIAGNOSIS — Z86010 Personal history of colonic polyps: Secondary | ICD-10-CM | POA: Diagnosis not present

## 2018-01-29 DIAGNOSIS — K219 Gastro-esophageal reflux disease without esophagitis: Secondary | ICD-10-CM | POA: Diagnosis not present

## 2018-01-29 DIAGNOSIS — Z800 Family history of malignant neoplasm of digestive organs: Secondary | ICD-10-CM | POA: Diagnosis not present

## 2018-03-16 DIAGNOSIS — K5730 Diverticulosis of large intestine without perforation or abscess without bleeding: Secondary | ICD-10-CM | POA: Diagnosis not present

## 2018-03-16 DIAGNOSIS — Z86010 Personal history of colonic polyps: Secondary | ICD-10-CM | POA: Diagnosis not present

## 2018-03-16 DIAGNOSIS — Z800 Family history of malignant neoplasm of digestive organs: Secondary | ICD-10-CM | POA: Diagnosis not present

## 2018-03-16 DIAGNOSIS — K640 First degree hemorrhoids: Secondary | ICD-10-CM | POA: Diagnosis not present

## 2018-03-16 DIAGNOSIS — Z1211 Encounter for screening for malignant neoplasm of colon: Secondary | ICD-10-CM | POA: Diagnosis not present

## 2018-03-16 DIAGNOSIS — D122 Benign neoplasm of ascending colon: Secondary | ICD-10-CM | POA: Diagnosis not present

## 2018-03-24 ENCOUNTER — Other Ambulatory Visit: Payer: Self-pay | Admitting: Obstetrics and Gynecology

## 2018-03-24 DIAGNOSIS — N393 Stress incontinence (female) (male): Secondary | ICD-10-CM | POA: Diagnosis not present

## 2018-03-24 DIAGNOSIS — Z01419 Encounter for gynecological examination (general) (routine) without abnormal findings: Secondary | ICD-10-CM | POA: Diagnosis not present

## 2018-03-24 DIAGNOSIS — Z1231 Encounter for screening mammogram for malignant neoplasm of breast: Secondary | ICD-10-CM

## 2018-03-24 DIAGNOSIS — Z124 Encounter for screening for malignant neoplasm of cervix: Secondary | ICD-10-CM | POA: Diagnosis not present

## 2018-03-26 DIAGNOSIS — Z0000 Encounter for general adult medical examination without abnormal findings: Secondary | ICD-10-CM | POA: Diagnosis not present

## 2018-04-02 DIAGNOSIS — Z0000 Encounter for general adult medical examination without abnormal findings: Secondary | ICD-10-CM | POA: Diagnosis not present

## 2018-04-07 ENCOUNTER — Ambulatory Visit: Payer: 59

## 2018-04-08 ENCOUNTER — Ambulatory Visit: Payer: Commercial Managed Care - HMO | Attending: Obstetrics and Gynecology

## 2018-04-08 ENCOUNTER — Other Ambulatory Visit: Payer: Self-pay

## 2018-04-08 DIAGNOSIS — R278 Other lack of coordination: Secondary | ICD-10-CM

## 2018-04-08 DIAGNOSIS — R490 Dysphonia: Secondary | ICD-10-CM | POA: Insufficient documentation

## 2018-04-08 DIAGNOSIS — M545 Low back pain: Secondary | ICD-10-CM | POA: Insufficient documentation

## 2018-04-08 DIAGNOSIS — N393 Stress incontinence (female) (male): Secondary | ICD-10-CM

## 2018-04-08 DIAGNOSIS — M62838 Other muscle spasm: Secondary | ICD-10-CM | POA: Insufficient documentation

## 2018-04-08 NOTE — Therapy (Signed)
[place] MAIN [facility] SERVICES [address] [place], [place], [address] Phone: [phone]   Fax:  [phone]  Physical Therapy Evaluation  Patient Details  Name: Carolyn Cunningham MRN: [id number] Date of Birth: January 09, 1951 Referring Provider: Max Fickle Shermerhorn   Encounter Date: 04/08/2018  PT End of Session - 04/11/18 1558    Visit Number  1    Number of Visits  12    Date for PT Re-Evaluation  07/04/18    PT Start Time  4825    PT Stop Time  1435    PT Time Calculation (min)  60 min    Activity Tolerance  Patient tolerated treatment well    Behavior During Therapy  [facility] for tasks assessed/performed       No past medical history on file.  Past Surgical History:  Procedure Laterality Date  . BLADDER SURGERY  04/2013   Bladder sling and tack  . BREAST BIOPSY Left 1992   EXCISIONAL - NEG  . BREAST SURGERY      There were no vitals filed for this visit.  Pelvic Floor Physical Therapy Evaluation and Assessment  SCREENING  Falls in last 6 mo: no    Patient's communication preference:   Red Flags:  Have you had any night sweats? no Unexplained weight loss? no Saddle anesthesia? no Unexplained changes in bowel or bladder habits? no  SUBJECTIVE  Patient reports: Had bladder tack and sling but is still having leakage, is needing a pad daily. She was on medicine for urgency and does not like the medicine because when she tried to go she had a hard time going to the bathroom. Has tried doing kegels some but quit.  She had a bout of L sided sciatica about a year ago which resolved after an injection.  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 Has to do bending sometimes to pick up things from low down, not heavy.  Recent Procedures/Tests/Findings:  Bladder tack and sling in Oct. 2014  Obstetrical History: 3 Vaginal  deliveries, some tearing with one   Gynecological History: none  Urinary History: Has been having urinary incontinence in about 2009, went for a physical one year and found out that she had a bladder prolapse but was unaware to that point.   Wearing one panty-liner per day, variability in dampness.  Gastrointestinal History: No problems, Has a BM almost every day without straining.  Sexual activity/pain: No pain with intercourse.  Location of pain: L side sciatica which occasionally feels "tired" if she does a lot of bending or squatting  Patient Goals: Would like to stop having to wear a pad.   OBJECTIVE  Posture/Observations:  Sitting:  Standing: Flat-back posture, posterior pelvic tilt, mild Anterior rotation of L innominate  Palpation/Segmental Motion/Joint Play: TTP through L>R Psoas and iliacus  Special tests:   Leg-length: 1/2 cm difference pre-treatment for L anterior rotation.  Range of Motion/Flexibilty: Deferred to next visit Spine: Hips:   Strength/MMT: Deferred to next visit  LE MMT  LE MMT Left Right  Hip flex:  (L2) /5 /5  Hip ext: /5 /5  Hip abd: /5 /5  Hip add: /5 /5  Hip IR /5 /5  Hip ER /5 /5     Abdominal: Deferred to next visit Palpation: Diastasis:  Pelvic Floor External Exam: Introitus Appears: mildly gaping Skin integrity: normal Palpation: noTTP  Cough: Paradoxical Prolapse visible?: no Scar mobility: decreased mobility  Internal Vaginal Exam: Strength (PERF): 0 Symmetry: R>L for tenderness Palpation: TTP throughout Prolapse: ~ at the level of the introitus with bearing down.   Internal Rectal Exam: Deferred indefinitely  Strength (PERF): Symmetry: Palpation: Prolapse:   Gait Analysis:   Pelvic Floor Outcome Measures: UIQ: 10/100   INTERVENTIONS THIS SESSION: Educated on the structure and function of the pelvic floor in relation to their symptoms as well as the POC, and initial HEP in order to set patient  expectations and understanding from which we will build on in the future sessions.  Total time: 55 min.       [facility] PT Assessment - 04/11/18 0001      Assessment   Medical Diagnosis  Stress Urinary Incontinence    Referring Provider  Thoman Shermerhorn      Precautions   Precautions  None      Restrictions   Weight Bearing Restrictions  No      Balance Screen   Has the patient fallen in the past 6 months  No      [place] residence    Living Arrangements  Spouse/significant other    Available Help at Discharge  Family    Type of [place] to enter    Entrance Stairs-Number of Steps  3    Entrance Stairs-Rails  None    Home Layout  One level    [place]  None      Prior Function   Level of Independence  Independent    Vocation  Full time employment    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               Objective measurements completed on examination: See above findings.                PT Short Term Goals - 04/11/18 1615      PT SHORT TERM GOAL #1   Title  "Patient will demonstrate improved pelvic alignment and balance of musculature surrounding the pelvis to facilitate decreased PFM spasms and decrease sciatica and improve PFM recruitment.    Baseline  L anterior innominate rotation    Time  6    Period  Weeks    Status  New    Target Date  05/23/18      PT SHORT TERM GOAL #2   Title  Patient will demonstrate HEP x1 in the clinic to demonstrate understanding and proper form to allow for further improvement.    Time  6    Period  Weeks    Status  New    Target Date  05/23/18      PT SHORT TERM GOAL #3   Title  Patient will demonstrate ability to perform self internal TP release in order to facilitate further PFM spasm reduction at home for faster resolution of symptoms    Time  6    Period  Weeks    Status  New    Target  Date  05/23/18        PT Long Term Goals - 04/11/18 1617      PT LONG TERM GOAL #1   Title  Patient will report no episodes of SUI over the course of the prior two weeks to demonstrate improved functional ability.  Baseline  wearing a panty-liner daily    Time  12    Period  Weeks    Status  New    Target Date  07/04/18      PT LONG TERM GOAL #2   Title  Patient will demonstrate a coordinated contraction, relaxation, and bulge of the pelvic floor muscles to demonstrate functional recruitment and motion and allow for further strengthening.    Baseline  Pt. is unable to contract or relax PFM on cue     Time  12    Period  Weeks    Status  New    Target Date  07/04/18      PT LONG TERM GOAL #3   Title  Patient will demonstrate functional recruitment of TA with breathing, sit-to-stand, squatting/lifting, and walking to allow for improved pelvic brace coordination, improved balance, and decreased downward pressure on the pelvic organs.    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     Baseline  0/5 strength in PFM    Time  12    Period  Weeks    Status  New    Target Date  07/04/18             Plan - 04/11/18 1606    Clinical Impression Statement  Pt. is a 67 y/o female who presents today with cheif c/o SUI that has not resolved following a bladder tack and sling in 2014. Her other relevant PMH includes 3 vaginal deliveries, one with tearing and recent bout of L sided sciatica that mostly resolved with injection. Her clinical exam reveals flat-back posture, L anterior innominate rotation, spasms through L>R Iliacus and Psoas, and PFM weakness, poor coordination, and spasm leading to dysfunction. SHe will benefit from skilled pelvic  PT to address these deficits and to continue to assess for and address other potential causes of SUI and Sciatica.      History and Personal Factors relevant to plan of care:  Bladder tack and sling in 2014, L sided Sciatica flare-up in April 2019     Clinical Presentation  Stable    Clinical Decision Making  Moderate    Rehab Potential  Good    PT Frequency  1x / week    PT Duration  12 weeks    PT Treatment/Interventions  ADLs/Self Care Home Management;Biofeedback;Electrical Stimulation;Moist Heat;Traction;Functional mobility training;Therapeutic activities;Therapeutic exercise;Patient/family education;Neuromuscular re-education;Manual techniques;Scar mobilization;Taping;Dry needling;Spinal Manipulations;Joint Manipulations    PT Next Visit Plan  Perform TP release vs. DN to Psoas and iliacus, PA to sacrum PRN, correct alignment, teach soda-can method and how to use body for re-positioning patients.    Consulted and Agree with Plan of Care  Patient       Patient will benefit from skilled therapeutic intervention in order to improve the following deficits and impairments:  Increased fascial restricitons, Improper body mechanics, Pain, Decreased coordination, Decreased mobility, Increased muscle spasms, Postural dysfunction, Decreased strength, Decreased range of motion  Visit Diagnosis: Other muscle spasm  Other lack of coordination  SUI (stress urinary incontinence, female)     Problem List There are no active problems to display for this patient.  Willa Rough DPT, ATC  Willa Rough 04/11/2018, 4:22 PM  [place] MAIN [facility] SERVICES [address] [place], [place], [address] Phone: [phone]   Fax:  [phone]  Name: Carolyn Cunningham MRN: [id number] Date of Birth: 08-28-1950

## 2018-04-15 ENCOUNTER — Ambulatory Visit: Payer: Commercial Managed Care - HMO

## 2018-04-15 DIAGNOSIS — N393 Stress incontinence (female) (male): Secondary | ICD-10-CM

## 2018-04-15 DIAGNOSIS — M62838 Other muscle spasm: Secondary | ICD-10-CM

## 2018-04-15 DIAGNOSIS — M545 Low back pain: Secondary | ICD-10-CM

## 2018-04-15 DIAGNOSIS — R278 Other lack of coordination: Secondary | ICD-10-CM

## 2018-04-15 DIAGNOSIS — R490 Dysphonia: Secondary | ICD-10-CM

## 2018-04-15 NOTE — Therapy (Signed)
[place] MAIN [facility] SERVICES [address] [place], [place], [address] Phone: [phone]   Fax:  [phone]  Physical Therapy Treatment  Patient Details  Name: Carolyn Cunningham MRN: [id number] Date of Birth: 1950/10/08 Referring Provider (PT): Max Fickle Shermerhorn   Encounter Date: 04/15/2018  PT End of Session - 04/15/18 1722    Visit Number  2    Number of Visits  12    Date for PT Re-Evaluation  07/04/18    PT Start Time  1430    PT Stop Time  1530    PT Time Calculation (min)  60 min    Activity Tolerance  Patient tolerated treatment well    Behavior During Therapy  [facility] for tasks assessed/performed       No past medical history on file.  Past Surgical History:  Procedure Laterality Date  . BLADDER SURGERY  04/2013   Bladder sling and tack  . BREAST BIOPSY Left 1992   EXCISIONAL - NEG  . BREAST SURGERY      There were no vitals filed for this visit.   Pelvic Floor Physical Therapy Treatment Note  SCREENING  Changes in medications, allergies, or medical history?: no    SUBJECTIVE  Patient reports: She is doing fine, no change since last visit.  Pain update: No pain   Patient Goals: Would like to stop having to wear a pad.    OBJECTIVE  Changes in: Posture/Observations:  L anterior innominate pre-treatment, even post-treatment.  Range of Motion/Flexibilty:  Decreased mobility through sacrum and lower thoracic and lumbar vertebrae. Pain with pressure. Improved following treatment.  Palpation: TTP to L Iliacus, Psoas, and Pectineus.   INTERVENTIONS THIS SESSION: Manual: performed TP release to L Iliacus, Psoas, and Pectineus and grade 3-4 PA mobs to all sacral borders and lower thoracic through lumbar spinous processes to decrease stiffness and tension on nerve roots and to allow for improved posture and ability to recruit PFM. NM re-ed: educated on seated and standing posture, diaphragmatic breathing and  pelvic tilt coordination with breathing as well as supine-to-sit, "pre-squeeze and sneeze",  sit-to-stand with exhale and soda-can theory to improve recruitment and timing of PFM for increased bladder prolapse support.  Total time: 60 min.                           PT Education - 04/15/18 1722    Education Details  See Pt. instructions and Interventions this session.    Person(s) Educated  Patient    Methods  Explanation;Demonstration;Tactile cues;Verbal cues;Handout    Comprehension  Verbalized understanding;Returned demonstration;Verbal cues required;Tactile cues required       PT Short Term Goals - 04/11/18 1615      PT SHORT TERM GOAL #1   Title  "Patient will demonstrate improved pelvic alignment and balance of musculature surrounding the pelvis to facilitate decreased PFM spasms and decrease sciatica and improve PFM recruitment.    Baseline  L anterior innominate rotation    Time  6    Period  Weeks    Status  New    Target Date  05/23/18      PT SHORT TERM GOAL #2   Title  Patient will demonstrate HEP x1 in the clinic to demonstrate understanding and proper form to allow for further improvement.    Time  6    Period  Weeks    Status  New    Target Date  05/23/18      PT SHORT TERM GOAL #3   Title  Patient will demonstrate ability to perform self internal TP release in order to facilitate further PFM spasm reduction at home for faster resolution of symptoms    Time  6    Period  Weeks    Status  New    Target Date  05/23/18        PT Long Term Goals - 04/11/18 1617      PT LONG TERM GOAL #1   Title  Patient will report no episodes of SUI over the course of the prior two weeks to demonstrate improved functional ability.    Baseline  wearing a panty-liner daily    Time  12    Period  Weeks    Status  New    Target Date  07/04/18      PT LONG TERM GOAL #2   Title  Patient will demonstrate a coordinated contraction, relaxation, and bulge of  the pelvic floor muscles to demonstrate functional recruitment and motion and allow for further strengthening.    Baseline  Pt. is unable to contract or relax PFM on cue     Time  12    Period  Weeks    Status  New    Target Date  07/04/18      PT LONG TERM GOAL #3   Title  Patient will demonstrate functional recruitment of TA with breathing, sit-to-stand, squatting/lifting, and walking to allow for improved pelvic brace coordination, improved balance, and decreased downward pressure on the pelvic organs.    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     Baseline  0/5 strength in PFM    Time  12    Period  Weeks    Status  New    Target Date  07/04/18            Plan - 04/15/18 1723    Clinical Impression Statement  Pt. responded well to all interventions today, demonstrating improved pelvic alignment and requiring only moderate verbal and tactile feedback to understand all education provided and demonstrate ability to perform correctly. Continue per POC.    Clinical Presentation  Stable    Clinical Decision Making  Moderate    Rehab Potential  Good    PT Frequency  1x / week    PT Duration  12 weeks    PT Treatment/Interventions  ADLs/Self Care Home Management;Biofeedback;Electrical Stimulation;Moist Heat;Traction;Functional mobility training;Therapeutic activities;Therapeutic exercise;Patient/family education;Neuromuscular re-education;Manual techniques;Scar mobilization;Taping;Dry needling;Spinal Manipulations;Joint Manipulations    PT Next Visit Plan  Bring pelvic tilts to supine and integrate with kegels, use tactile feedback or biofeedback PRN. TP release internally first if needed.    Consulted and Agree with Plan of Care  Patient        Patient will benefit from skilled therapeutic intervention in order to improve the following deficits and impairments:  Increased fascial restricitons, Improper body mechanics, Pain, Decreased coordination, Decreased mobility, Increased muscle spasms, Postural dysfunction, Decreased strength, Decreased range of motion  Visit Diagnosis: Other muscle spasm  Other lack of coordination  SUI (stress urinary incontinence, female)  Dysphonia  Low back pain, unspecified  back pain laterality, unspecified chronicity, with sciatica presence unspecified     Problem List There are no active problems to display for this patient.  Willa Rough DPT, ATC Willa Rough 04/15/2018, 5:28 PM  [place] MAIN [facility] SERVICES [address] [place], [place], [address] Phone: [phone]   Fax:  [phone]  Name: TONILYNN BIEKER MRN: [id number] Date of Birth: 03/31/1951

## 2018-04-15 NOTE — Patient Instructions (Addendum)
   When seated, you want to maintain pelvic neutral with the shoulders gently down and back and ears in line with your shoulders. A lumbar roll such as the one below or a home-made towel-roll can be used for this purpose. Even Olympic athletes can only maintain proper seated posture for about 10 minutes without support!    Pictured: The Original [employer] Lumbar Roll  Stabilization: Diaphragmatic Breathing    Lie with knees bent, feet flat. Place one hand on stomach, other on chest. Breathe deeply through nose, lifting belly hand without any motion of hand on chest. Repeat _10___ times per set. Do __3__ sets per session. Do __7__ sessions per week.     Sit, feet flat, scoot forward to the edge of the chair. Inhale as you bend forward at hips, begin to exhale just before and while you stand, contracting the glutes, lower tummy muscles and pelvic floor as if stopping urination as you stand up.   * Do this every time you sit or stand! If you catch yourself doing it "wrong, re-set and do it again so it can become habit!    Do 2 sets of 15 tilts per day. Breathe in when you tilt forward (A) and out when you tuck under (B).

## 2018-04-20 DIAGNOSIS — K219 Gastro-esophageal reflux disease without esophagitis: Secondary | ICD-10-CM | POA: Diagnosis not present

## 2018-04-20 DIAGNOSIS — R490 Dysphonia: Secondary | ICD-10-CM | POA: Diagnosis not present

## 2018-04-22 ENCOUNTER — Ambulatory Visit: Payer: Commercial Managed Care - HMO | Attending: Obstetrics and Gynecology

## 2018-04-22 DIAGNOSIS — R278 Other lack of coordination: Secondary | ICD-10-CM | POA: Diagnosis not present

## 2018-04-22 DIAGNOSIS — R490 Dysphonia: Secondary | ICD-10-CM | POA: Diagnosis present

## 2018-04-22 DIAGNOSIS — M62838 Other muscle spasm: Secondary | ICD-10-CM | POA: Diagnosis present

## 2018-04-22 DIAGNOSIS — N393 Stress incontinence (female) (male): Secondary | ICD-10-CM | POA: Insufficient documentation

## 2018-04-22 NOTE — Patient Instructions (Addendum)
Pelvic Tilt With Pelvic Floor (Hook-Lying)        Lie with hips and knees bent. Squeeze pelvic floor and flatten low back while breathing out so that pelvis tilts. Repeat _10x2__ times. Do _2__ times a day.    Do 2 sets of 15 tilts per day. Breathe in when you tilt forward (A) and out when you tuck under (B).When you tuck under try to feel your vagina squeeze like you are stopping the flow of urine. Repeat 10x2 at least once per day.

## 2018-04-22 NOTE — Therapy (Signed)
[place] MAIN [facility] SERVICES [address] [place], [place], [address] Phone: [phone]   Fax:  [phone]  Physical Therapy Treatment  Patient Details  Name: Carolyn Cunningham MRN: [id number] Date of Birth: 1951/02/18 Referring Provider (PT): Max Fickle Shermerhorn   Encounter Date: 04/22/2018  PT End of Session - 04/26/18 1201    Visit Number  3    Number of Visits  12    Date for PT Re-Evaluation  07/04/18    PT Start Time  1430    PT Stop Time  1530    PT Time Calculation (min)  60 min    Activity Tolerance  Patient tolerated treatment well    Behavior During Therapy  [facility] for tasks assessed/performed       No past medical history on file.  Past Surgical History:  Procedure Laterality Date  . BLADDER SURGERY  04/2013   Bladder sling and tack  . BREAST BIOPSY Left 1992   EXCISIONAL - NEG  . BREAST SURGERY      There were no vitals filed for this visit.    Pelvic Floor Physical Therapy Treatment Note  SCREENING  Changes in medications, allergies, or medical history?: no    SUBJECTIVE  Patient reports: Things are going OK, still having leakage but is trying to do all the things we have discussed.  Pain update: No pain.  Patient Goals: Would like to stop having to wear a pad.   OBJECTIVE  Changes in: Posture/Observations:  Hyperkyphotic/lordotic  Pelvic floor: TTP to Posterior fourchette. Strength increased from 0/5 to ~3/5 with NM re-ed during session and maximal verbal and tactile cueing.  Abdominal:  Has strong deep-core but has difficulty with timing contraction and PFM contraction.   INTERVENTIONS THIS SESSION: Manual: performed TP release to posterior fourchette to decrease tension and improve motion for greater proprioception and decreased spasm to allow better recruitment and coordination of the PFM. NM re-ed: Used maximal verbal and tactile cueing to improve coordination of PFM with exhale and  posterior pelvic tilt, allowing Pt. Improved recruitment demonstrated by significant "strength" gain within session.  Therex: Educated on performance of supine and seated posterior pelvic tilts with kegel to continue to increase strength and recruitment of PFM for decreased incontinence.  Total time: 60 min.                          PT Education - 04/26/18 1200    Education Details  See Pt. Instructions and interventions this session.    Person(s) Educated  Patient    Methods  Explanation;Tactile cues;Verbal cues;Handout    Comprehension  Verbalized understanding;Returned demonstration;Verbal cues required;Tactile cues required;Need further instruction       PT Short Term Goals - 04/11/18 1615      PT SHORT TERM GOAL #1   Title  "Patient will demonstrate improved pelvic alignment and balance of musculature surrounding the pelvis to facilitate decreased PFM spasms and decrease sciatica and improve PFM recruitment.    Baseline  L anterior innominate rotation    Time  6    Period  Weeks    Status  New    Target Date  05/23/18      PT SHORT TERM GOAL #2   Title  Patient will demonstrate HEP x1 in the clinic to demonstrate understanding and proper form to allow for further improvement.    Time  6    Period  Weeks  Status  New    Target Date  05/23/18      PT SHORT TERM GOAL #3   Title  Patient will demonstrate ability to perform self internal TP release in order to facilitate further PFM spasm reduction at home for faster resolution of symptoms    Time  6    Period  Weeks    Status  New    Target Date  05/23/18        PT Long Term Goals - 04/11/18 1617      PT LONG TERM GOAL #1   Title  Patient will report no episodes of SUI over the course of the prior two weeks to demonstrate improved functional ability.    Baseline  wearing a panty-liner daily    Time  12    Period  Weeks    Status  New    Target Date  07/04/18      PT LONG TERM GOAL #2    Title  Patient will demonstrate a coordinated contraction, relaxation, and bulge of the pelvic floor muscles to demonstrate functional recruitment and motion and allow for further strengthening.    Baseline  Pt. is unable to contract or relax PFM on cue     Time  12    Period  Weeks    Status  New    Target Date  07/04/18      PT LONG TERM GOAL #3   Title  Patient will demonstrate functional recruitment of TA with breathing, sit-to-stand, squatting/lifting, and walking to allow for improved pelvic brace coordination, improved balance, and decreased downward pressure on the pelvic organs.    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     Baseline  0/5 strength in PFM    Time  12    Period  Weeks    Status  New    Target Date  07/04/18            Plan - 04/26/18 1201    Clinical Impression Statement  Pt. responded well to all interventions this session, demonstrating a strength gain from 0/5 to 3-/5 following treatment and improved recruitment as well as understanding of and appropriate performance of all new exercises. Continue per POC.    Clinical Presentation  Stable    Clinical Decision Making  Moderate    Rehab Potential  Good    PT Frequency  1x / week    PT Duration  12 weeks    PT Treatment/Interventions  ADLs/Self Care Home Management;Biofeedback;Electrical Stimulation;Moist Heat;Traction;Functional mobility training;Therapeutic activities;Therapeutic exercise;Patient/family education;Neuromuscular re-education;Manual techniques;Scar mobilization;Taping;Dry needling;Spinal Manipulations;Joint Manipulations    PT Next Visit Plan  Review Kegels with Posterior pelvic tilt in seated and supine. Teach squat-off-wall and forward stepping  with kegel as well as further discuss pre-squeeeze and sneeze, work on "ha-ha" exercise    PT Home Exercise Plan  Posterior pelvic tilt with kegel in supine and sitting, sit-to-stand, diaphragmatic breathing, seated and standing posture, soda-can theory.    Consulted and Agree with Plan of Care  Patient       Patient will benefit from skilled therapeutic intervention in order to improve the following deficits and impairments:  Increased fascial restricitons, Improper body mechanics, Pain, Decreased coordination, Decreased mobility, Increased muscle spasms, Postural dysfunction, Decreased strength, Decreased range of motion  Visit Diagnosis: Other muscle spasm  Other lack of coordination  SUI (stress urinary incontinence, female)     Problem List There are no active problems to display for this patient.  Willa Rough DPT, ATC Willa Rough 04/26/2018, 12:06 PM  [place] MAIN [facility] SERVICES [address] [place], [place], [address] Phone: [phone]   Fax:  [phone]  Name: Carolyn Cunningham MRN: [id number] Date of Birth: [birth year]

## 2018-04-27 ENCOUNTER — Other Ambulatory Visit: Payer: Self-pay

## 2018-04-27 ENCOUNTER — Encounter (HOSPITAL_COMMUNITY): Payer: Self-pay

## 2018-04-27 ENCOUNTER — Emergency Department (HOSPITAL_COMMUNITY): Payer: Commercial Managed Care - HMO

## 2018-04-27 ENCOUNTER — Emergency Department (HOSPITAL_COMMUNITY)
Admission: EM | Admit: 2018-04-27 | Discharge: 2018-04-27 | Disposition: A | Discharge status: Home / Self Care | Payer: Commercial Managed Care - HMO | Attending: Emergency Medicine | Admitting: Emergency Medicine

## 2018-04-27 DIAGNOSIS — W108XXA Fall (on) (from) other stairs and steps, initial encounter: Secondary | ICD-10-CM | POA: Insufficient documentation

## 2018-04-27 DIAGNOSIS — W19XXXA Unspecified fall, initial encounter: Secondary | ICD-10-CM

## 2018-04-27 DIAGNOSIS — Z79899 Other long term (current) drug therapy: Secondary | ICD-10-CM | POA: Insufficient documentation

## 2018-04-27 DIAGNOSIS — M25561 Pain in right knee: Secondary | ICD-10-CM | POA: Diagnosis present

## 2018-04-27 DIAGNOSIS — M25572 Pain in left ankle and joints of left foot: Secondary | ICD-10-CM

## 2018-04-27 DIAGNOSIS — S8991XA Unspecified injury of right lower leg, initial encounter: Secondary | ICD-10-CM | POA: Diagnosis not present

## 2018-04-27 NOTE — ED Provider Notes (Signed)
[facility] EMERGENCY DEPARTMENT Provider Note   CSN: [id number] Arrival date & time: 04/27/18  4098     History   Chief Complaint Chief Complaint  Patient presents with  . Fall    HPI Carolyn Cunningham is a 67 y.o. female.  Accidental trip and fall down one step last night injuring her right knee and left ankle.  No head or neck trauma.  Severity of pain is mild to moderate.  Palpation and position make pain worse.     History reviewed. No pertinent past medical history.  There are no active problems to display for this patient.   Past Surgical History:  Procedure Laterality Date  . BLADDER SURGERY  04/2013   Bladder sling and tack  . BREAST BIOPSY Left 1992   EXCISIONAL - NEG  . BREAST SURGERY       OB History   None      Home Medications    Prior to Admission medications   Medication Sig Start Date End Date Taking? Authorizing Provider  BIOTIN PO Take 1 tablet by mouth daily.    [provider]  Calcium Carbonate-Vitamin D (CALCIUM + D PO) Take 1 tablet by mouth daily.    [provider]  Cholecalciferol (VITAMIN D PO) Take 1 tablet by mouth daily.    [provider]  fesoterodine (TOVIAZ) 4 MG TB24 tablet Take 4 mg by mouth daily.    [provider]  fish oil-omega-3 fatty acids 1000 MG capsule Take 1 g by mouth daily.    [provider]  HYDROcodone-acetaminophen (NORCO/VICODIN) 5-325 MG per tablet Take 0.5-2 tablets by mouth every 6 (six) hours as needed for pain.    [provider]  MAGNESIUM PO Take by mouth.    [provider]  Misc Natural Products (OSTEO BI-FLEX ADV JOINT SHIELD PO) Take 1 tablet by mouth daily.    [provider]  Multiple Vitamins-Minerals (ZINC PO) Take 1 tablet by mouth daily.    [provider]  nitrofurantoin, macrocrystal-monohydrate, (MACROBID) 100 MG capsule Take 100 mg by mouth 2 (two) times daily. 7 day course beginning on 04/27/13. Recent  increase from one capsule daily to two capsules as directed per MD    [provider]  omeprazole (PRILOSEC) 10 MG capsule Take 10 mg by mouth daily.    [provider]  POTASSIUM PO Take 1 tablet by mouth daily.    [provider]  predniSONE (DELTASONE) 10 MG tablet Take 2 tablets (20 mg total) by mouth daily. Patient not taking: Reported on 04/08/2018 11/22/17   Nat Christen, MD  Pyridoxine HCl (B-6 PO) Take 1 tablet by mouth daily.    [provider]    Family History Family History  Problem Relation Age of Onset  . Breast cancer Neg Hx     Social History Social History   Tobacco Use  . Smoking status: Never Smoker  . Smokeless tobacco: Never Used  Substance Use Topics  . Alcohol use: No  . Drug use: No     Allergies   Sulfa antibiotics   Review of Systems Review of Systems  All other systems reviewed and are negative.    Physical Exam Updated Vital Signs BP 132/65 (BP Location: Left Arm)   Pulse 68   Temp 98.9 F (37.2 C) (Oral)   Resp 20   Ht 5\' 4"  (1.626 m)   Wt 69.9 kg   SpO2 99%   BMI 26.43 kg/m  Physical Exam  Constitutional: She is oriented to person, place, and time. She appears well-developed and well-nourished.  HENT:  Head: Normocephalic and atraumatic.  Eyes: Conjunctivae are normal.  Neck: Neck supple.  Cardiovascular: Normal rate and regular rhythm.  Pulmonary/Chest: Effort normal and breath sounds normal.  Abdominal: Soft. Bowel sounds are normal.  Musculoskeletal:  Tender peri-left ankle and anterior right knee  Neurological: She is alert and oriented to person, place, and time.  Skin: Skin is warm and dry.  Psychiatric: She has a normal mood and affect. Her behavior is normal.  Nursing note and vitals reviewed.    ED Treatments / Results  Labs (all labs ordered are listed, but only abnormal results are displayed) Labs Reviewed - No data to display  EKG None  Radiology Dg Ankle Complete  Left  Result Date: 04/27/2018 CLINICAL DATA:  Fall downstairs with ankle pain, initial encounter EXAM: LEFT ANKLE COMPLETE - 3+ VIEW COMPARISON:  None. FINDINGS: Considerable soft tissue swelling is noted laterally. No acute fracture or dislocation is noted. Calcaneal spurring is seen. IMPRESSION: Soft tissue swelling without acute bony abnormality. Electronically Signed   By: Inez Catalina M.D.   On: 04/27/2018 09:14   Dg Knee Complete 4 Views Right  Result Date: 04/27/2018 CLINICAL DATA:  Fall down stairs yesterday with knee pain, initial encounter EXAM: RIGHT KNEE - COMPLETE 4+ VIEW COMPARISON:  None. FINDINGS: No evidence of fracture, dislocation, or joint effusion. No evidence of arthropathy or other focal bone abnormality. Soft tissues are unremarkable. IMPRESSION: No acute abnormality noted. Electronically Signed   By: Inez Catalina M.D.   On: 04/27/2018 09:04    Procedures Procedures (including critical care time)  Medications Ordered in ED Medications - No data to display   Initial Impression / Assessment and Plan / ED Course  I have reviewed the triage vital signs and the nursing notes.  Pertinent labs & imaging results that were available during my care of the patient were reviewed by me and considered in my medical decision making (see chart for details).     Status post fall with pain in right knee and left ankle.  Plain films negative.  No head or neck trauma.  Final Clinical Impressions(s) / ED Diagnoses   Final diagnoses:  Fall, initial encounter  Acute pain of right knee  Acute left ankle pain    ED Discharge Orders    None       Nat Christen, MD 04/27/18 1006

## 2018-04-27 NOTE — Discharge Instructions (Addendum)
X-rays were normal.  He will be sore for several days.  Ankle brace, ice, elevate, ibuprofen

## 2018-04-27 NOTE — ED Triage Notes (Signed)
Pt reports fell walking down steps last night.  C/o pain to r knee and left ankle.  Swelling noted to both.  Abrasion noted to r knee.

## 2018-04-29 ENCOUNTER — Ambulatory Visit
Admission: RE | Admit: 2018-04-29 | Discharge: 2018-04-29 | Disposition: A | Discharge status: Home / Self Care | Payer: Commercial Managed Care - HMO | Source: Ambulatory Visit | Attending: Obstetrics and Gynecology | Admitting: Obstetrics and Gynecology

## 2018-04-29 ENCOUNTER — Ambulatory Visit: Payer: Commercial Managed Care - HMO

## 2018-04-29 DIAGNOSIS — Z1231 Encounter for screening mammogram for malignant neoplasm of breast: Secondary | ICD-10-CM | POA: Insufficient documentation

## 2018-05-06 ENCOUNTER — Ambulatory Visit: Payer: Commercial Managed Care - HMO

## 2018-05-06 DIAGNOSIS — M62838 Other muscle spasm: Secondary | ICD-10-CM

## 2018-05-06 DIAGNOSIS — R490 Dysphonia: Secondary | ICD-10-CM

## 2018-05-06 DIAGNOSIS — R278 Other lack of coordination: Secondary | ICD-10-CM

## 2018-05-06 DIAGNOSIS — N393 Stress incontinence (female) (male): Secondary | ICD-10-CM

## 2018-05-06 NOTE — Therapy (Signed)
[place] MAIN [facility] SERVICES [address] [place], [place], [address] Phone: [phone]   Fax:  [phone]  Physical Therapy Treatment  Patient Details  Name: Carolyn Cunningham MRN: [id number] Date of Birth: 1950-09-16 Referring Provider (PT): Max Fickle Shermerhorn   Encounter Date: 05/06/2018  PT End of Session - 05/07/18 1535    Visit Number  4    Number of Visits  12    Date for PT Re-Evaluation  07/04/18    PT Start Time  1330    PT Stop Time  1430    PT Time Calculation (min)  60 min    Activity Tolerance  Patient tolerated treatment well    Behavior During Therapy  [facility] for tasks assessed/performed       No past medical history on file.  Past Surgical History:  Procedure Laterality Date  . BLADDER SURGERY  04/2013   Bladder sling and tack  . BREAST EXCISIONAL BIOPSY Left 1992   benign  . BREAST SURGERY      There were no vitals filed for this visit.    Pelvic Floor Physical Therapy Treatment Note  SCREENING  Changes in medications, allergies, or medical history?: She fell and hurt her R Knee and L ankle    SUBJECTIVE  Patient reports: She has not been able to do her exercises the way she wants to because of falling and hurting her knee and ankle. Feels that she is not having quite as wet of a pad.  Pain update: Her pain is in her knee and ankle from her fall.  Patient Goals: Would like to stop having to wear a pad.   OBJECTIVE  Changes in: Posture/Observations:  Mild anterior pelvic tilt.  Abdominal:  Pt. Has difficulty coordinating her deep core musculature with activation of distal musculature.  INTERVENTIONS THIS SESSION: NM re-ed: Practiced pelvic tilts in seated on ball and in hook-lying with exhale and moderate tactile and verbal cueing and worked on "Ha-ha" exercise, coughing and sneezing to improve coordination of deep core/PFM with increased abdominal pressure to prevent SUI and educated on and  practiced squat form with exhale with exertion to protect the PFM as back and knee pain.   Total time: 60 min.                          PT Education - 05/07/18 1531    Education Details  See Pt. Instructions and Interventions this session    Person(s) Educated  Patient    Methods  Explanation;Demonstration;Verbal cues;Handout;Tactile cues    Comprehension  Verbalized understanding;Returned demonstration;Verbal cues required;Tactile cues required       PT Short Term Goals - 04/11/18 1615      PT SHORT TERM GOAL #1   Title  "Patient will demonstrate improved pelvic alignment and balance of musculature surrounding the pelvis to facilitate decreased PFM spasms and decrease sciatica and improve PFM recruitment.    Baseline  L anterior innominate rotation    Time  6    Period  Weeks    Status  New    Target Date  05/23/18      PT SHORT TERM GOAL #2   Title  Patient will demonstrate HEP x1 in the clinic to demonstrate understanding and proper form to allow for further improvement.    Time  6    Period  Weeks    Status  New    Target Date  05/23/18  PT SHORT TERM GOAL #3   Title  Patient will demonstrate ability to perform self internal TP release in order to facilitate further PFM spasm reduction at home for faster resolution of symptoms    Time  6    Period  Weeks    Status  New    Target Date  05/23/18        PT Long Term Goals - 04/11/18 1617      PT LONG TERM GOAL #1   Title  Patient will report no episodes of SUI over the course of the prior two weeks to demonstrate improved functional ability.    Baseline  wearing a panty-liner daily    Time  12    Period  Weeks    Status  New    Target Date  07/04/18      PT LONG TERM GOAL #2   Title  Patient will demonstrate a coordinated contraction, relaxation, and bulge of the pelvic floor muscles to demonstrate functional recruitment and motion and allow for further strengthening.    Baseline  Pt. is  unable to contract or relax PFM on cue     Time  12    Period  Weeks    Status  New    Target Date  07/04/18      PT LONG TERM GOAL #3   Title  Patient will demonstrate functional recruitment of TA with breathing, sit-to-stand, squatting/lifting, and walking to allow for improved pelvic brace coordination, improved balance, and decreased downward pressure on the pelvic organs.    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     Baseline  0/5 strength in PFM    Time  12    Period  Weeks    Status  New    Target Date  07/04/18            Plan - 05/07/18 1538    Clinical Impression Statement  Pt. responded well to all interventions today, demonstrating improved coordination and recruitment of deep core following treatment as well as understanding of all education provided. Continue per POC.     Clinical Presentation  Stable    Clinical Decision Making  Moderate    Rehab Potential  Good    PT Frequency  1x / week    PT Duration  12 weeks    PT Treatment/Interventions  ADLs/Self Care Home Management;Biofeedback;Electrical Stimulation;Moist Heat;Traction;Functional mobility training;Therapeutic activities;Therapeutic exercise;Patient/family education;Neuromuscular re-education;Manual techniques;Scar mobilization;Taping;Dry needling;Spinal Manipulations;Joint Manipulations    PT Next Visit Plan  Review HEP and perform further internal TP release and re-check strength.    PT Home Exercise Plan  Posterior pelvic tilt with kegel in supine and sitting, sit-to-stand, diaphragmatic breathing, seated and standing posture, soda-can theory.    Consulted and Agree with Plan of Care  Patient       Patient will benefit from skilled therapeutic  intervention in order to improve the following deficits and impairments:  Increased fascial restricitons, Improper body mechanics, Pain, Decreased coordination, Decreased mobility, Increased muscle spasms, Postural dysfunction, Decreased strength, Decreased range of motion  Visit Diagnosis: Other muscle spasm  Other lack of coordination  SUI (stress urinary incontinence, female)  Dysphonia     Problem List There are no active problems to display for this patient.  Willa Rough DPT, ATC Willa Rough 05/07/2018, 3:42 PM  [place] MAIN [facility] SERVICES [address] [place], [place], [address] Phone: [phone]   Fax:  [phone]  Name: Carolyn Cunningham MRN: [id number] Date of Birth: [birth year]

## 2018-05-06 NOTE — Patient Instructions (Addendum)
"  Pre-squeeze and sneeze"  Before you cough, sneeze, laugh etc. "pre-squeeze" the pelvic floor (kegel) and hold it until you finish coughing to retrain the muscles to hold the urine in during these activities.    Use a tuck under with your pelvis as you cough, sneeze, laugh.  Exhale on Exertion!

## 2018-05-13 ENCOUNTER — Ambulatory Visit: Payer: Commercial Managed Care - HMO

## 2018-05-13 DIAGNOSIS — R278 Other lack of coordination: Secondary | ICD-10-CM

## 2018-05-13 DIAGNOSIS — M62838 Other muscle spasm: Secondary | ICD-10-CM | POA: Diagnosis not present

## 2018-05-13 DIAGNOSIS — R490 Dysphonia: Secondary | ICD-10-CM

## 2018-05-13 DIAGNOSIS — N393 Stress incontinence (female) (male): Secondary | ICD-10-CM

## 2018-05-13 NOTE — Therapy (Addendum)
[place] MAIN [facility] SERVICES [address] [place], [place], [address] Phone: [phone]   Fax:  [phone]  Physical Therapy Treatment  Patient Details  Name: Carolyn Cunningham MRN: [id number] Date of Birth: Jun 26, 1951 Referring Provider (PT): Max Fickle Shermerhorn   Encounter Date: 05/13/2018  PT End of Session - 05/17/18 5732    Visit Number  5    Number of Visits  12    Date for PT Re-Evaluation  07/04/18    PT Start Time  1330    PT Stop Time  1430    PT Time Calculation (min)  60 min    Activity Tolerance  Patient tolerated treatment well    Behavior During Therapy  [facility] for tasks assessed/performed       No past medical history on file.  Past Surgical History:  Procedure Laterality Date  . BLADDER SURGERY  04/2013   Bladder sling and tack  . BREAST EXCISIONAL BIOPSY Left 1992   benign  . BREAST SURGERY      There were no vitals filed for this visit.    Pelvic Floor Physical Therapy Treatment Note  SCREENING  Changes in medications, allergies, or medical history?: no    SUBJECTIVE  Patient reports: She has a pain in L breast that started this morning, has not done anything to pull a muscle etc. She Is doing well with leakage, was having good days all week until today, today has been worse. On good days she has no leakage without awareness but may have minimal leakage ~ 1 time per day when bladder is full. Bad days she still has leakage without awareness.  Pain update: Has soreness in L breast starting this morning, feels it at rest. R knee hurts with pressure and first thing in the morning  But feels ok once she is up and moving around.  Patient Goals: Would like to stop having to wear a pad.   OBJECTIVE  Changes in:  Pelvic floor: Improved coordination of PFM following treatment, able to perform coordinated kegel without pelvic tilt with ~85% consistency. Strength 2+/5  INTERVENTIONS THIS SESSION: Manual:  Performed TP release internally to decrease spasms and improve ability to recruit PFM for strengthening. NM re-ed: reviewed coordination of kegel with and without pelvic tilt. THerex: educated on and practiced pelvic tilts with pillow under hips and kegels throughout the day.  Total time: 60 min.                          PT Education - 05/17/18 2025    Education Details  See Pt. Instructions and Interventions this session    Person(s) Educated  Patient    Methods  Explanation;Tactile cues;Verbal cues;Handout    Comprehension  Verbalized understanding;Returned demonstration;Verbal cues required;Tactile cues required       PT Short Term Goals - 04/11/18 1615      PT SHORT TERM GOAL #1   Title  "Patient will demonstrate improved pelvic alignment and balance of musculature surrounding the pelvis to facilitate decreased PFM spasms and decrease sciatica and improve PFM recruitment.    Baseline  L anterior innominate rotation    Time  6    Period  Weeks    Status  New    Target Date  05/23/18      PT SHORT TERM GOAL #2   Title  Patient will demonstrate HEP x1 in the clinic to demonstrate understanding and proper form to  allow for further improvement.    Time  6    Period  Weeks    Status  New    Target Date  05/23/18      PT SHORT TERM GOAL #3   Title  Patient will demonstrate ability to perform self internal TP release in order to facilitate further PFM spasm reduction at home for faster resolution of symptoms    Time  6    Period  Weeks    Status  New    Target Date  05/23/18        PT Long Term Goals - 04/11/18 1617      PT LONG TERM GOAL #1   Title  Patient will report no episodes of SUI over the course of the prior two weeks to demonstrate improved functional ability.    Baseline  wearing a panty-liner daily    Time  12    Period  Weeks    Status  New    Target Date  07/04/18      PT LONG TERM GOAL #2   Title  Patient will demonstrate a  coordinated contraction, relaxation, and bulge of the pelvic floor muscles to demonstrate functional recruitment and motion and allow for further strengthening.    Baseline  Pt. is unable to contract or relax PFM on cue     Time  12    Period  Weeks    Status  New    Target Date  07/04/18      PT LONG TERM GOAL #3   Title  Patient will demonstrate functional recruitment of TA with breathing, sit-to-stand, squatting/lifting, and walking to allow for improved pelvic brace coordination, improved balance, and decreased downward pressure on the pelvic organs.    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     Baseline  0/5 strength in PFM    Time  12    Period  Weeks    Status  New    Target Date  07/04/18            Plan - 05/17/18 7253    Clinical Impression Statement  Pt. responded well to all interventions today, demonstrating improved PFM coordination without use of accessory muscles. Demonstrated understanding of all education provided. Continue per POC.    Clinical Presentation  Stable    Clinical Decision Making  Moderate    Rehab Potential  Good    PT Frequency  1x / week    PT Duration  12 weeks    PT Treatment/Interventions  ADLs/Self Care Home Management;Biofeedback;Electrical Stimulation;Moist Heat;Traction;Functional mobility training;Therapeutic activities;Therapeutic exercise;Patient/family education;Neuromuscular re-education;Manual techniques;Scar mobilization;Taping;Dry needling;Spinal Manipulations;Joint Manipulations    PT Next Visit Plan  re-check pelvic alignment and back. assess thoracic mobility, pecs. assess adductors.    PT Home Exercise Plan  Posterior pelvic tilt with kegel in supine and sitting, sit-to-stand,  diaphragmatic breathing, seated and standing posture, soda-can theory, kegels, posterior pelvic tilts with hips elevated.    Consulted and Agree with Plan of Care  Patient       Patient will benefit from skilled therapeutic intervention in order to improve the following deficits and impairments:  Increased fascial restricitons,  Improper body mechanics, Pain, Decreased coordination, Decreased mobility, Increased muscle spasms, Postural dysfunction, Decreased strength, Decreased range of motion  Visit Diagnosis: Other muscle spasm  Other lack of coordination  SUI (stress urinary incontinence, female)  Dysphonia     Problem List There are no active problems to display for this patient.  Willa Rough DPT, ATC Willa Rough 05/17/2018, 8:27 AM  [place] MAIN [facility] SERVICES [address] [place], [place], [address] Phone: [phone]   Fax:  [phone]  Name: Carolyn Cunningham MRN: [id number] Date of Birth: 1951-01-12

## 2018-05-13 NOTE — Patient Instructions (Addendum)
Pelvic Tilt With Pelvic Floor (Hook-Lying)        Lie with hips and knees bent. Squeeze pelvic floor and flatten low back while breathing out so that pelvis tilts. Repeat _2x10__ times. Do _1-2__ times a day or as needed.  Put a pillow under your hips in this position when doing this exercise to help decrease pressure in the pelvis when it feels "heavy" or as if a ball is in the vagina.  Kegel exercises:    With neutral spine, tighten pelvic floor by imagining you are stopping the flow of urine, squeezing only around the vagina and anus.  Quick-Flicks: Pull up and in quickly and then relax allowing just enough time for the muscles to full lengthen before the next contraction. Do _10__ repetitions in a row, stopping if the pelvic floor muscle gets tired and other muscles try to take over.  Long-Holds: Hold for _8__ seconds and then fully release, repeat _5__ times.   Repeat both of these exercises _5__ times throughout the day

## 2018-05-20 ENCOUNTER — Ambulatory Visit: Payer: Commercial Managed Care - HMO

## 2018-05-20 DIAGNOSIS — M62838 Other muscle spasm: Secondary | ICD-10-CM

## 2018-05-20 DIAGNOSIS — N393 Stress incontinence (female) (male): Secondary | ICD-10-CM

## 2018-05-20 DIAGNOSIS — R490 Dysphonia: Secondary | ICD-10-CM

## 2018-05-20 DIAGNOSIS — R278 Other lack of coordination: Secondary | ICD-10-CM

## 2018-05-20 NOTE — Patient Instructions (Signed)
Single Leg Balance: Eyes Open    Stand on right leg with eyes open. Hold _up to 30__ seconds. _3__ reps __1_ times per day.  * at first you will have to "add up" to 30 seconds with smaller holds as you are able. When this is easy, move to eyes closed (below)  Single Leg Balance: Eyes Closed    Stand on right leg. Close

## 2018-05-20 NOTE — Therapy (Signed)
[place] MAIN [facility] SERVICES [address] [place], [place], [address] Phone: [phone]   Fax:  [phone]  Physical Therapy Treatment  Patient Details  Name: Carolyn Cunningham MRN: [id number] Date of Birth: 1951-01-09 Referring Provider (PT): Max Fickle Shermerhorn   Encounter Date: 05/20/2018  PT End of Session - 05/20/18 1420    Visit Number  6    Number of Visits  12    Date for PT Re-Evaluation  07/04/18    PT Start Time  1330    PT Stop Time  1415    PT Time Calculation (min)  45 min    Activity Tolerance  Patient tolerated treatment well    Behavior During Therapy  [facility] for tasks assessed/performed       No past medical history on file.  Past Surgical History:  Procedure Laterality Date  . BLADDER SURGERY  04/2013   Bladder sling and tack  . BREAST EXCISIONAL BIOPSY Left 1992   benign  . BREAST SURGERY      There were no vitals filed for this visit.    Pelvic Floor Physical Therapy Treatment Note  SCREENING  Changes in medications, allergies, or medical history?: no   SUBJECTIVE  Patient reports: She is doing alright, exercises are getting a little easier and she has not had any leakage over the last week. Is drinking ~ 1.5 cups of coffee per day. Found out that her bra was bothering her breast.   Pain update: No pain  Patient Goals: Would like to stop having to wear a pad.   OBJECTIVE  Changes in: Posture/Observations:  Very mild L anterior rotation  Range of Motion/Flexibilty:  Decreased mobility at L sacral border and ~ L2 spinous process  Abdominal:  Pt. Demonstrates decreased balance due to deep-core weakness, she was able to balance on L LE for 5 seconds on first attempt and for 8 seconds on R LE for her first attempt. She increased by ~ 5 seconds on both sides when she performed kegel and slight posterior tilt during balance.  Palpation: TTP to L L2 lumbar paraspinals  Gait  Analysis:  INTERVENTIONS THIS SESSION: Therex: Educated on and practiced balance to decrease risk of falling and help strengthen PFM for decreased SUI. Manual: Performed TP release to L lumbar paraspinals and performed PA mobs to L sacral border and L2 spinous process to improve mobility, alignment, and decrease spasm for continued pain and leakage relief.  Total time: 60 min.                           PT Education - 05/20/18 1420    Education Details  See Pt. Instructions and Interventions this session.    Person(s) Educated  Patient    Methods  Explanation;Demonstration;Verbal cues;Handout    Comprehension  Verbalized understanding;Returned demonstration;Verbal cues required       PT Short Term Goals - 05/20/18 1339      PT SHORT TERM GOAL #1   Title  "Patient will demonstrate improved pelvic alignment and balance of musculature surrounding the pelvis to facilitate decreased PFM spasms and decrease sciatica and improve PFM recruitment.    Baseline  L anterior innominate rotation    Time  6    Period  Weeks    Status  Achieved    Target Date  05/23/18      PT SHORT TERM GOAL #2   Title  Patient will demonstrate HEP  x1 in the clinic to demonstrate understanding and proper form to allow for further improvement.    Time  6    Period  Weeks    Status  Achieved    Target Date  05/23/18      PT SHORT TERM GOAL #3   Title  Patient will demonstrate ability to perform self internal TP release in order to facilitate further PFM spasm reduction at home for faster resolution of symptoms    Baseline  Pt. demonstrated decreased spasms with one treatment and has not needed to perform self release    Time  6    Period  Weeks    Status  Achieved    Target Date  05/23/18        PT Long Term Goals - 04/11/18 1617      PT LONG TERM GOAL #1   Title  Patient will report no episodes of SUI over the course of the prior two weeks to demonstrate improved functional  ability.    Baseline  wearing a panty-liner daily    Time  12    Period  Weeks    Status  New    Target Date  07/04/18      PT LONG TERM GOAL #2   Title  Patient will demonstrate a coordinated contraction, relaxation, and bulge of the pelvic floor muscles to demonstrate functional recruitment and motion and allow for further strengthening.    Baseline  Pt. is unable to contract or relax PFM on cue     Time  12    Period  Weeks    Status  New    Target Date  07/04/18      PT LONG TERM GOAL #3   Title  Patient will demonstrate functional recruitment of TA with breathing, sit-to-stand, squatting/lifting, and walking to allow for improved pelvic brace coordination, improved balance, and decreased downward pressure on the pelvic organs.    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     Baseline  0/5 strength in PFM    Time  12    Period  Weeks    Status  New    Target Date  07/04/18            Plan - 05/20/18 1421    Clinical Impression Statement  Pt. has been doing well, has not had any leakage over the past week or any pain other than that in her R knee from her fall. She has met all short term goals. She responded well to all interventions today, demonstrating improved sacral mobility on L and appropriate performance of balance exercise with use of deep-core to improve stability. Continue every-other week to increase strength and prevent return of symptoms.    Clinical Presentation  Stable    Clinical Decision Making  Moderate    Rehab Potential  Good    PT Frequency  1x / week    PT Duration  12 weeks    PT Treatment/Interventions  ADLs/Self Care Home Management;Biofeedback;Electrical Stimulation;Moist Heat;Traction;Functional  mobility training;Therapeutic activities;Therapeutic exercise;Patient/family education;Neuromuscular re-education;Manual techniques;Scar mobilization;Taping;Dry needling;Spinal Manipulations;Joint Manipulations    PT Next Visit Plan  re-check pelvic alignment and back.  assess thoracic mobility, pecs. assess adductors. increase core exercises as appropriate.    PT Home Exercise Plan  Posterior pelvic tilt with kegel in supine and sitting, sit-to-stand, diaphragmatic breathing, seated and standing posture, soda-can theory, kegels, posterior pelvic tilts with hips elevated. single-leg balance with eyes open then closed.    Consulted and Agree with Plan of Care  Patient       Patient will benefit from skilled therapeutic intervention in order to improve the following deficits and impairments:  Increased fascial restricitons, Improper body mechanics, Pain, Decreased coordination, Decreased mobility, Increased muscle spasms, Postural dysfunction, Decreased strength, Decreased range of motion  Visit Diagnosis: Other muscle spasm  Other lack of coordination  SUI (stress urinary incontinence, female)  Dysphonia     Problem List There are no active problems to display for this patient.  Willa Rough DPT, ATC Willa Rough 05/20/2018, 2:51 PM  [place] MAIN [facility] SERVICES [address] [place], [place], [address] Phone: [phone]   Fax:  [phone]  Name: Carolyn Cunningham MRN: [id number] Date of Birth: 06-Jan-1951

## 2018-05-31 DIAGNOSIS — H0014 Chalazion left upper eyelid: Secondary | ICD-10-CM | POA: Diagnosis not present

## 2018-06-03 ENCOUNTER — Ambulatory Visit: Payer: Commercial Managed Care - HMO

## 2018-06-08 DIAGNOSIS — H00014 Hordeolum externum left upper eyelid: Secondary | ICD-10-CM | POA: Diagnosis not present

## 2018-09-07 DIAGNOSIS — H2513 Age-related nuclear cataract, bilateral: Secondary | ICD-10-CM | POA: Diagnosis not present

## 2018-09-07 DIAGNOSIS — H0014 Chalazion left upper eyelid: Secondary | ICD-10-CM | POA: Diagnosis not present

## 2019-03-13 ENCOUNTER — Encounter (HOSPITAL_COMMUNITY): Payer: Self-pay | Admitting: Emergency Medicine

## 2019-03-13 ENCOUNTER — Emergency Department (HOSPITAL_COMMUNITY)
Admission: EM | Admit: 2019-03-13 | Discharge: 2019-03-13 | Disposition: A | Discharge status: Home / Self Care | Payer: 59 | Attending: Emergency Medicine | Admitting: Emergency Medicine

## 2019-03-13 ENCOUNTER — Emergency Department (HOSPITAL_COMMUNITY): Payer: 59

## 2019-03-13 ENCOUNTER — Other Ambulatory Visit: Payer: Self-pay

## 2019-03-13 DIAGNOSIS — R109 Unspecified abdominal pain: Secondary | ICD-10-CM | POA: Diagnosis present

## 2019-03-13 DIAGNOSIS — Z79899 Other long term (current) drug therapy: Secondary | ICD-10-CM | POA: Diagnosis not present

## 2019-03-13 DIAGNOSIS — N201 Calculus of ureter: Secondary | ICD-10-CM | POA: Diagnosis not present

## 2019-03-13 DIAGNOSIS — K8020 Calculus of gallbladder without cholecystitis without obstruction: Secondary | ICD-10-CM | POA: Diagnosis present

## 2019-03-13 DIAGNOSIS — K5790 Diverticulosis of intestine, part unspecified, without perforation or abscess without bleeding: Secondary | ICD-10-CM | POA: Diagnosis not present

## 2019-03-13 LAB — CBC WITH DIFFERENTIAL/PLATELET
Abs Immature Granulocytes: 0.07 10*3/uL (ref 0.00–0.07)
Basophils Absolute: 0.1 10*3/uL (ref 0.0–0.1)
Basophils Relative: 1 %
Eosinophils Absolute: 0.1 10*3/uL (ref 0.0–0.5)
Eosinophils Relative: 1 %
HCT: 40.4 % (ref 36.0–46.0)
Hemoglobin: 13.5 g/dL (ref 12.0–15.0)
Immature Granulocytes: 1 %
Lymphocytes Relative: 10 %
Lymphs Abs: 1.3 10*3/uL (ref 0.7–4.0)
MCH: 30.5 pg (ref 26.0–34.0)
MCHC: 33.4 g/dL (ref 30.0–36.0)
MCV: 91.2 fL (ref 80.0–100.0)
Monocytes Absolute: 0.8 10*3/uL (ref 0.1–1.0)
Monocytes Relative: 6 %
Neutro Abs: 11 10*3/uL — ABNORMAL HIGH (ref 1.7–7.7)
Neutrophils Relative %: 81 %
Platelets: 372 10*3/uL (ref 150–400)
RBC: 4.43 MIL/uL (ref 3.87–5.11)
RDW: 13 % (ref 11.5–15.5)
WBC: 13.3 10*3/uL — ABNORMAL HIGH (ref 4.0–10.5)
nRBC: 0 % (ref 0.0–0.2)

## 2019-03-13 LAB — COMPREHENSIVE METABOLIC PANEL
ALT: 21 U/L (ref 0–44)
AST: 22 U/L (ref 15–41)
Albumin: 4.3 g/dL (ref 3.5–5.0)
Alkaline Phosphatase: 96 U/L (ref 38–126)
Anion gap: 10 (ref 5–15)
BUN: 16 mg/dL (ref 8–23)
CO2: 26 mmol/L (ref 22–32)
Calcium: 9.7 mg/dL (ref 8.9–10.3)
Chloride: 107 mmol/L (ref 98–111)
Creatinine, Ser: 0.83 mg/dL (ref 0.44–1.00)
GFR calc Af Amer: >60 mL/min (ref >60)
GFR calc non Af Amer: >60 mL/min (ref >60)
Glucose, Bld: 132 mg/dL — ABNORMAL HIGH (ref 70–99)
Potassium: 3.9 mmol/L (ref 3.5–5.1)
Sodium: 143 mmol/L (ref 135–145)
Total Bilirubin: 0.8 mg/dL (ref 0.3–1.2)
Total Protein: 7.5 g/dL (ref 6.5–8.1)

## 2019-03-13 LAB — URINALYSIS, ROUTINE W REFLEX MICROSCOPIC
Bilirubin Urine: NEGATIVE
Glucose, UA: NEGATIVE mg/dL
Hgb urine dipstick: NEGATIVE
Ketones, ur: 5 mg/dL — AB
Nitrite: NEGATIVE
Protein, ur: 100 mg/dL — AB
Specific Gravity, Urine: 1.019 (ref 1.005–1.030)
pH: 9 — ABNORMAL HIGH (ref 5.0–8.0)

## 2019-03-13 LAB — LIPASE, BLOOD: Lipase: 26 U/L (ref 11–51)

## 2019-03-13 MED ORDER — ONDANSETRON 4 MG PO TBDP: 4.0000 mg | ORAL_TABLET | Freq: Three times a day (TID) | ORAL | 0 refills | Status: DC | PRN

## 2019-03-13 MED ORDER — ONDANSETRON HCL 4 MG/2ML IJ SOLN
4.0000 mg | Freq: Once | INTRAMUSCULAR | Status: AC
  Administered 2019-03-13: 4 mg via INTRAVENOUS
  Filled 2019-03-13: qty 2

## 2019-03-13 MED ORDER — HYDROCODONE-ACETAMINOPHEN 5-325 MG PO TABS: 1.0000 | ORAL_TABLET | Freq: Four times a day (QID) | ORAL | 0 refills | Status: DC | PRN

## 2019-03-13 MED ORDER — MORPHINE SULFATE (PF) 4 MG/ML IV SOLN
4.0000 mg | Freq: Once | INTRAVENOUS | Status: AC
  Administered 2019-03-13: 4 mg via INTRAVENOUS
  Filled 2019-03-13: qty 1

## 2019-03-13 MED ORDER — FENTANYL CITRATE (PF) 100 MCG/2ML IJ SOLN
50.0000 ug | Freq: Once | INTRAMUSCULAR | Status: AC
  Administered 2019-03-13: 50 ug via INTRAVENOUS
  Filled 2019-03-13: qty 2

## 2019-03-13 NOTE — Discharge Instructions (Signed)
Today you received medications that may make you sleepy or impair your ability to make decisions.  For the next 24 hours please do not drive, operate heavy machinery, care for a small child with out another adult present, or perform any activities that may cause harm to you or someone else if you were to fall asleep or be impaired.   You are being prescribed a medication which may make you sleepy. Please follow up of listed precautions for at least 24 hours after taking one dose.   Please take Ibuprofen (Advil, motrin) and Tylenol (acetaminophen) to relieve your pain.  You may take up to 600 MG (3 pills) of normal strength ibuprofen every 8 hours as needed.  In between doses of ibuprofen you make take tylenol, up to 1,000 mg (two extra strength pills).  Do not take more than 3,000 mg tylenol in a 24 hour period.  Please check all medication labels as many medications such as pain and cold medications may contain tylenol.  Do not drink alcohol while taking these medications.  Do not take other NSAID'S while taking ibuprofen (such as aleve or naproxen).  Please take ibuprofen with food to decrease stomach upset.

## 2019-03-13 NOTE — ED Triage Notes (Signed)
Per EMS patient called out due to bilateral flank pain. States urinary frequency but not able to put out much urine.

## 2019-03-13 NOTE — ED Provider Notes (Signed)
Medical screening examination/treatment/procedure(s) were conducted as a shared visit with non-physician practitioner(s) and myself.  I personally evaluated the patient during the encounter.  Clinical Impression:   Final diagnoses:  Ureterolithiasis  Diverticulosis  Calculus of gallbladder without cholecystitis without obstruction    The patient presented with acute onset of flank pain radiating down towards the groin.  On exam the patient has no significant tenderness and feels much better after getting medication.  This was very colicky in nature, CT scan confirmed small ureterolithiasis, the patient is stable for discharge, no signs of urinary tract infection, urine culture sent.  I told the patient her results - she expressed understanding and agreement with the plan.   Noemi Chapel, MD 03/13/19 [phone]

## 2019-03-13 NOTE — ED Provider Notes (Signed)
[facility] EMERGENCY DEPARTMENT Provider Note   CSN: [id number] Arrival date & time: 03/13/19  1833     History   Chief Complaint Chief Complaint  Patient presents with   Flank Pain    HPI Carolyn Cunningham is a 68 y.o. female with a past medical history of lumbar disc disease, tubular adenoma, status post bladder sling intact who presents today for evaluation of left-sided abdomen and back pain.  She reports that at about 6 she was walking through the house when she felt a pain in her left side.  She states that she felt like she had to urinate however when she did she was only able to urinate a small amount.  She states that she had pain with urination.  She frequently feels the need to urinate however is only able to urinate a very small amount.  Dysuria, increased urgency and frequency.  She denies any history of kidney stones.  She reports that her pain is now into her left sided back.  She denies any fevers.  She is nauseous which she attributes to the pain.  She denies any weakness or recent trauma.     HPI  History reviewed. No pertinent past medical history.  Patient Active Problem List   Diagnosis Date Noted   Diverticulosis 03/13/2019   Calculus of gallbladder without cholecystitis without obstruction 03/13/2019   Family history of colon cancer 04/01/2017   Lumbar disc disease 04/01/2017   Tubular adenoma 04/01/2017    Past Surgical History:  Procedure Laterality Date   BLADDER SURGERY  04/2013   Bladder sling and tack   BREAST EXCISIONAL BIOPSY Left 1992   benign   BREAST SURGERY       OB History   No obstetric history on file.      Home Medications    Prior to Admission medications   Medication Sig Start Date End Date Taking? Authorizing Provider  BIOTIN PO Take 1 tablet by mouth daily.   Yes [provider]  Calcium Carbonate-Vitamin D (CALCIUM + D PO) Take 1 tablet by mouth daily.   Yes [provider]  Cholecalciferol  (VITAMIN D PO) Take 1 tablet by mouth daily.   Yes [provider]  fesoterodine (TOVIAZ) 4 MG TB24 tablet Take 4 mg by mouth daily.   Yes [provider]  fish oil-omega-3 fatty acids 1000 MG capsule Take 1 g by mouth daily.   Yes [provider]  magnesium oxide (MAG-OX) 400 MG tablet Take 1 tablet by mouth daily.   Yes [provider]  Misc Natural Products (OSTEO BI-FLEX ADV JOINT SHIELD PO) Take 1 tablet by mouth daily.   Yes [provider]  Multiple Vitamins-Minerals (ZINC PO) Take 1 tablet by mouth daily.   Yes [provider]  omeprazole (PRILOSEC) 10 MG capsule Take 10 mg by mouth daily.   Yes [provider]  pantoprazole (PROTONIX) 40 MG tablet Take 1 tablet by mouth daily. 01/23/19  Yes [provider]  POTASSIUM PO Take 1 tablet by mouth daily.   Yes [provider]  Pyridoxine HCl (B-6 PO) Take 1 tablet by mouth daily.   Yes [provider]  HYDROcodone-acetaminophen (NORCO/VICODIN) 5-325 MG tablet Take 1 tablet by mouth every 6 (six) hours as needed for severe pain. 03/13/19   Lorin Glass, PA-C  ondansetron (ZOFRAN ODT) 4 MG disintegrating tablet Take 1 tablet (4 mg total) by mouth every 8 (eight) hours as needed for nausea or  vomiting. 03/13/19   Lorin Glass, PA-C    Family History Family History  Problem Relation Age of Onset   Breast cancer Neg Hx     Social History Social History   Tobacco Use   Smoking status: Never Smoker   Smokeless tobacco: Never Used  Substance Use Topics   Alcohol use: No   Drug use: No     Allergies   Sulfa antibiotics   Review of Systems Review of Systems  Constitutional: Negative for chills and fever.  Gastrointestinal: Positive for nausea. Negative for abdominal pain, constipation and diarrhea.  Genitourinary: Positive for difficulty urinating, dysuria, flank pain, frequency and urgency.  Musculoskeletal: Positive for back  pain. Negative for neck pain.  Neurological: Negative for weakness and headaches.  Psychiatric/Behavioral: Negative for confusion.  All other systems reviewed and are negative.    Physical Exam Updated Vital Signs BP (!) 146/67    Pulse 79    Temp 98.4 F (36.9 C) (Oral)    Resp 14    Ht 5\' 4"  (1.626 m)    Wt 70.8 kg    SpO2 99%    BMI 26.78 kg/m   Physical Exam Vitals signs and nursing note reviewed.  Constitutional:      Appearance: She is well-developed.     Comments: Appears uncomfortable, in pain, constantly moving.  HENT:     Head: Normocephalic and atraumatic.     Mouth/Throat:     Mouth: Mucous membranes are moist.  Eyes:     Conjunctiva/sclera: Conjunctivae normal.  Neck:     Musculoskeletal: Neck supple.  Cardiovascular:     Rate and Rhythm: Normal rate and regular rhythm.     Pulses: Normal pulses.     Heart sounds: Normal heart sounds. No murmur.     Comments: 2+ DP, PT pulses bilaterally. Pulmonary:     Effort: Pulmonary effort is normal. No respiratory distress.     Breath sounds: Normal breath sounds.  Abdominal:     General: Abdomen is flat. Bowel sounds are normal. There is no distension.     Palpations: Abdomen is soft. There is no mass.     Tenderness: There is no abdominal tenderness. There is no right CVA tenderness, left CVA tenderness or guarding.     Hernia: No hernia is present.  Musculoskeletal:     Right lower leg: No edema.     Left lower leg: No edema.  Skin:    General: Skin is warm and dry.  Neurological:     General: No focal deficit present.     Mental Status: She is alert.     Cranial Nerves: No cranial nerve deficit.  Psychiatric:        Mood and Affect: Mood normal.        Behavior: Behavior normal.      ED Treatments / Results  Labs (all labs ordered are listed, but only abnormal results are displayed) Labs Reviewed  URINALYSIS, ROUTINE W REFLEX MICROSCOPIC - Abnormal; Notable for the following components:      Result  Value   pH 9.0 (*)    Ketones, ur 5 (*)    Protein, ur 100 (*)    Leukocytes,Ua TRACE (*)    Bacteria, UA RARE (*)    All other components within normal limits  COMPREHENSIVE METABOLIC PANEL - Abnormal; Notable for the following components:   Glucose, Bld 132 (*)    All other components within normal limits  CBC WITH DIFFERENTIAL/PLATELET - Abnormal;  Notable for the following components:   WBC 13.3 (*)    Neutro Abs 11.0 (*)    All other components within normal limits  URINE CULTURE  LIPASE, BLOOD    EKG EKG Interpretation  Date/Time:  Sunday March 13 2019 19:30:14 EDT Ventricular Rate:  65 PR Interval:    QRS Duration: 84 QT Interval:  437 QTC Calculation: 455 R Axis:   58 Text Interpretation:  Sinus rhythm No old tracing to compare Confirmed by Noemi Chapel [phone] on 03/13/2019 7:44:25 PM   Radiology Ct Renal Stone Study  Result Date: 03/13/2019 CLINICAL DATA:  Hervey Ard bilateral intermittent flank pain with frequency EXAM: CT ABDOMEN AND PELVIS WITHOUT CONTRAST TECHNIQUE: Multidetector CT imaging of the abdomen and pelvis was performed following the standard protocol without IV contrast. COMPARISON:  CT 04/29/2013 FINDINGS: Lower chest: Lung bases demonstrate no acute consolidation or effusion. The heart size is normal. Hepatobiliary: Cysts within the liver. 1.7 cm calcified gallstone. No biliary dilatation Pancreas: Unremarkable. No pancreatic ductal dilatation or surrounding inflammatory changes. Spleen: Normal in size without focal abnormality. Adrenals/Urinary Tract: Adrenal glands are normal. No significant hydronephrosis. Slight prominence of the left ureter. Punctate 1-2 mm stone in the distal left ureter, at the left UPJ, coronal series 5, image number 55. No right hydronephrosis. Stomach/Bowel: Stomach is within normal limits. Appendix appears normal. No evidence of bowel wall thickening, distention, or inflammatory changes. Sigmoid colon diverticular disease without  acute inflammatory change. Vascular/Lymphatic: Nonaneurysmal aorta. No significantly enlarged lymph nodes. Reproductive: Partially calcified uterine fibroids. No adnexal mass. Hyperdense foci in the vagina, could reflect calcifications or hemorrhagic or proteinaceous cyst. Other: Negative for free air or free fluid. Musculoskeletal: No acute or suspicious osseous abnormality IMPRESSION: 1. Punctate 1-2 mm stone in the distal left ureter at the left UPJ with slight prominence of left ureter but no significant hydronephrosis of the left kidney. Right kidney is without hydronephrosis or ureteral stone 2. Gallstone 3. Sigmoid colon diverticular disease without acute inflammatory change Electronically Signed   By: Donavan Foil M.D.   On: 03/13/2019 20:20    Procedures Procedures (including critical care time)  Medications Ordered in ED Medications  fentaNYL (SUBLIMAZE) injection 50 mcg (50 mcg Intravenous Given 03/13/19 1921)  ondansetron (ZOFRAN) injection 4 mg (4 mg Intravenous Given 03/13/19 1949)  morphine 4 MG/ML injection 4 mg (4 mg Intravenous Given 03/13/19 2025)     Initial Impression / Assessment and Plan / ED Course  I have reviewed the triage vital signs and the nursing notes.  Pertinent labs & imaging results that were available during my care of the patient were reviewed by me and considered in my medical decision making (see chart for details).  Clinical Course as of Mar 13 2131  Sun Mar 13, 2019  1935 QTC not prolonged   EKG 12-Lead [EH]  1945 According to RN post void residual bladder scan was negligible.   [EH]  2047 Patient reevaluated.  We discussed results of her CT scan.  She reports that right now she does not have any pain and feels much better.  Will p.o. challenge.   [EH]  2126 Patient reevaluated, she has passed p.o. challenge, rates her pain at 0 out of 10 states she is ready to go home.   [EH]    Clinical Course User Index [EH] Lorin Glass, PA-C        Patient presents today for evaluation of sudden onset of left lower abdominal pain and flank pain.  She  reports urinary frequency urgency and dysuria.  On exam she initially appeared uncomfortable, writhing in bed.  Her pain was treated with fentanyl.  Labs were obtained and reviewed, she has a slight leukocytosis at 13.3 which I suspect is reactive given her pain, nausea, and vomiting.  CMP does not show significant electrolyte derangements.  Urine shows trace leukocytes, and 100 protein, microscopy shows rare bacteria with 0-5 squamous epithelials, WBCs, RBCs.  Given her dysuria, frequency, and urgency will send urine for culture however given the presence of only rare bacteria, we will not empirically treat.  CT renal stone study was obtained showing diverticulosis without diverticulitis, a left-sided distal ureteral 1 to 2 mm stone with mild hydroureter without left-sided hydronephrosis.    On reevaluation her pain dropped to a 0 out of 10.  She was able to pass p.o. challenge without difficulty.    Return precautions were discussed with patient who states their understanding.  At the time of discharge patient denied any unaddressed complaints or concerns.  Patient is agreeable for discharge home.   This patient was seen as a shared visit with Dr. Sabra Heck.   Final Clinical Impressions(s) / ED Diagnoses   Final diagnoses:  Ureterolithiasis  Diverticulosis  Calculus of gallbladder without cholecystitis without obstruction    ED Discharge Orders         Ordered    HYDROcodone-acetaminophen (NORCO/VICODIN) 5-325 MG tablet  Every 6 hours PRN     03/13/19 2131    ondansetron (ZOFRAN ODT) 4 MG disintegrating tablet  Every 8 hours PRN     03/13/19 2131           Ollen Gross 03/13/19 2132    Noemi Chapel, MD 03/13/19 [phone]

## 2019-03-15 LAB — URINE CULTURE: Culture: <10000 — AB

## 2019-03-29 ENCOUNTER — Other Ambulatory Visit: Payer: Self-pay | Admitting: Obstetrics and Gynecology

## 2019-03-29 DIAGNOSIS — Z1231 Encounter for screening mammogram for malignant neoplasm of breast: Secondary | ICD-10-CM

## 2019-04-05 DIAGNOSIS — E042 Nontoxic multinodular goiter: Secondary | ICD-10-CM | POA: Insufficient documentation

## 2019-04-05 DIAGNOSIS — N200 Calculus of kidney: Secondary | ICD-10-CM | POA: Insufficient documentation

## 2019-05-02 ENCOUNTER — Ambulatory Visit
Admission: RE | Admit: 2019-05-02 | Discharge: 2019-05-02 | Disposition: A | Discharge status: Home / Self Care | Payer: 59 | Source: Ambulatory Visit | Attending: Obstetrics and Gynecology | Admitting: Obstetrics and Gynecology

## 2019-05-02 DIAGNOSIS — Z1231 Encounter for screening mammogram for malignant neoplasm of breast: Secondary | ICD-10-CM | POA: Diagnosis present

## 2020-04-17 ENCOUNTER — Other Ambulatory Visit: Payer: Self-pay | Admitting: Internal Medicine

## 2020-04-17 DIAGNOSIS — Z1231 Encounter for screening mammogram for malignant neoplasm of breast: Secondary | ICD-10-CM

## 2020-04-18 ENCOUNTER — Emergency Department (HOSPITAL_COMMUNITY): Payer: 59

## 2020-04-18 ENCOUNTER — Encounter (HOSPITAL_COMMUNITY): Payer: Self-pay

## 2020-04-18 ENCOUNTER — Emergency Department (HOSPITAL_COMMUNITY)
Admission: EM | Admit: 2020-04-18 | Discharge: 2020-04-18 | Disposition: A | Discharge status: Home / Self Care | Payer: 59 | Attending: Emergency Medicine | Admitting: Emergency Medicine

## 2020-04-18 ENCOUNTER — Other Ambulatory Visit: Payer: Self-pay

## 2020-04-18 DIAGNOSIS — K529 Noninfective gastroenteritis and colitis, unspecified: Secondary | ICD-10-CM

## 2020-04-18 DIAGNOSIS — K921 Melena: Secondary | ICD-10-CM | POA: Diagnosis not present

## 2020-04-18 DIAGNOSIS — R1032 Left lower quadrant pain: Secondary | ICD-10-CM | POA: Diagnosis present

## 2020-04-18 HISTORY — DX: Diverticulitis of intestine, part unspecified, without perforation or abscess without bleeding: K57.92

## 2020-04-18 HISTORY — DX: Irritable bowel syndrome, unspecified: K58.9

## 2020-04-18 HISTORY — DX: Disorder of kidney and ureter, unspecified: N28.9

## 2020-04-18 LAB — COMPREHENSIVE METABOLIC PANEL
ALT: 24 U/L (ref 0–44)
AST: 26 U/L (ref 15–41)
Albumin: 3.7 g/dL (ref 3.5–5.0)
Alkaline Phosphatase: 92 U/L (ref 38–126)
Anion gap: 9 (ref 5–15)
BUN: 13 mg/dL (ref 8–23)
CO2: 23 mmol/L (ref 22–32)
Calcium: 9 mg/dL (ref 8.9–10.3)
Chloride: 108 mmol/L (ref 98–111)
Creatinine, Ser: 0.57 mg/dL (ref 0.44–1.00)
GFR calc Af Amer: >60 mL/min (ref >60)
GFR calc non Af Amer: >60 mL/min (ref >60)
Glucose, Bld: 153 mg/dL — ABNORMAL HIGH (ref 70–99)
Potassium: 3.4 mmol/L — ABNORMAL LOW (ref 3.5–5.1)
Sodium: 140 mmol/L (ref 135–145)
Total Bilirubin: 0.7 mg/dL (ref 0.3–1.2)
Total Protein: 6.8 g/dL (ref 6.5–8.1)

## 2020-04-18 LAB — CBC
HCT: 41.9 % (ref 36.0–46.0)
Hemoglobin: 14.2 g/dL (ref 12.0–15.0)
MCH: 30.9 pg (ref 26.0–34.0)
MCHC: 33.9 g/dL (ref 30.0–36.0)
MCV: 91.3 fL (ref 80.0–100.0)
Platelets: 415 10*3/uL — ABNORMAL HIGH (ref 150–400)
RBC: 4.59 MIL/uL (ref 3.87–5.11)
RDW: 13.2 % (ref 11.5–15.5)
WBC: 11.3 10*3/uL — ABNORMAL HIGH (ref 4.0–10.5)
nRBC: 0 % (ref 0.0–0.2)

## 2020-04-18 LAB — LIPASE, BLOOD: Lipase: 25 U/L (ref 11–51)

## 2020-04-18 LAB — TYPE AND SCREEN
ABO/RH(D): AB POS
Antibody Screen: NEGATIVE

## 2020-04-18 MED ORDER — HYDROCODONE-ACETAMINOPHEN 5-325 MG PO TABS: 1.0000 | ORAL_TABLET | Freq: Four times a day (QID) | ORAL | 0 refills | Status: DC | PRN

## 2020-04-18 MED ORDER — METRONIDAZOLE IN NACL 5-0.79 MG/ML-% IV SOLN
500.0000 mg | Freq: Once | INTRAVENOUS | Status: AC
  Administered 2020-04-18: 500 mg via INTRAVENOUS
  Filled 2020-04-18: qty 100

## 2020-04-18 MED ORDER — CIPROFLOXACIN HCL 500 MG PO TABS: 500.0000 mg | ORAL_TABLET | Freq: Two times a day (BID) | ORAL | 0 refills | Status: DC

## 2020-04-18 MED ORDER — MORPHINE SULFATE (PF) 4 MG/ML IV SOLN
4.0000 mg | Freq: Once | INTRAVENOUS | Status: AC
  Administered 2020-04-18: 4 mg via INTRAVENOUS
  Filled 2020-04-18: qty 1

## 2020-04-18 MED ORDER — CIPROFLOXACIN IN D5W 400 MG/200ML IV SOLN
400.0000 mg | Freq: Once | INTRAVENOUS | Status: AC
  Administered 2020-04-18: 400 mg via INTRAVENOUS
  Filled 2020-04-18: qty 200

## 2020-04-18 MED ORDER — SODIUM CHLORIDE 0.9 % IV BOLUS
1000.0000 mL | Freq: Once | INTRAVENOUS | Status: AC
  Administered 2020-04-18: 1000 mL via INTRAVENOUS

## 2020-04-18 MED ORDER — ONDANSETRON HCL 4 MG/2ML IJ SOLN
4.0000 mg | Freq: Once | INTRAMUSCULAR | Status: AC
  Administered 2020-04-18: 4 mg via INTRAVENOUS
  Filled 2020-04-18: qty 2

## 2020-04-18 MED ORDER — IOHEXOL 300 MG/ML  SOLN
100.0000 mL | Freq: Once | INTRAMUSCULAR | Status: AC | PRN
  Administered 2020-04-18: 100 mL via INTRAVENOUS

## 2020-04-18 MED ORDER — METRONIDAZOLE 500 MG PO TABS: 500.0000 mg | ORAL_TABLET | Freq: Three times a day (TID) | ORAL | 0 refills | Status: DC

## 2020-04-18 MED ORDER — ONDANSETRON HCL 8 MG PO TABS: 8.0000 mg | ORAL_TABLET | Freq: Three times a day (TID) | ORAL | 0 refills | Status: DC | PRN

## 2020-04-18 NOTE — ED Provider Notes (Signed)
[facility] EMERGENCY DEPARTMENT Provider Note   CSN: [id number] Arrival date & time: 04/18/20  1010     History Chief Complaint  Patient presents with  . Abdominal Pain    Carolyn Cunningham is a 69 y.o. female.  Patient is a 68 year old female with history of diverticulosis, renal calculi.  She presents today for evaluation of left lower quadrant pain.  This has worsened over the past several days.  She reports episodes of fecal urgency and noticed blood in her stool this morning.  She denies fevers or chills.  She did have some vomiting last night that was nonbloody.  Patient tells me she has had diverticulosis identified on prior colonoscopies, but denies diverticulitis in the past.  The history is provided by the patient.  Abdominal Pain Pain location:  LLQ Pain quality: stabbing   Pain radiates to:  Does not radiate Pain severity:  Moderate Onset quality:  Gradual Duration:  3 days Timing:  Constant Progression:  Worsening Chronicity:  New Relieved by:  Nothing Worsened by:  Movement, palpation and bowel movements      Past Medical History:  Diagnosis Date  . Diverticulitis   . IBS (irritable bowel syndrome)   . Renal disorder    kidney stone    Patient Active Problem List   Diagnosis Date Noted  . Diverticulosis 03/13/2019  . Calculus of gallbladder without cholecystitis without obstruction 03/13/2019  . Family history of colon cancer 04/01/2017  . Lumbar disc disease 04/01/2017  . Tubular adenoma 04/01/2017    Past Surgical History:  Procedure Laterality Date  . BLADDER SURGERY  04/2013   Bladder sling and tack  . BREAST EXCISIONAL BIOPSY Left 1992   benign  . BREAST SURGERY       OB History   No obstetric history on file.     Family History  Problem Relation Age of Onset  . Breast cancer Neg Hx     Social History   Tobacco Use  . Smoking status: Never Smoker  . Smokeless tobacco: Never Used  Substance Use Topics  . Alcohol use: No  .  Drug use: No    Home Medications Prior to Admission medications   Medication Sig Start Date End Date Taking? Authorizing Provider  BIOTIN PO Take 1 tablet by mouth daily.   Yes [provider]  Calcium Carbonate-Vitamin D (CALCIUM + D PO) Take 1 tablet by mouth daily.   Yes [provider]  Cholecalciferol (VITAMIN D PO) Take 1 tablet by mouth daily.   Yes [provider]  fish oil-omega-3 fatty acids 1000 MG capsule Take 1 g by mouth daily.   Yes [provider]  magnesium oxide (MAG-OX) 400 MG tablet Take 1 tablet by mouth daily.   Yes [provider]  Misc Natural Products (OSTEO BI-FLEX ADV JOINT SHIELD PO) Take 1 tablet by mouth daily.   Yes [provider]  Multiple Vitamins-Minerals (ZINC PO) Take 1 tablet by mouth daily.   Yes [provider]  POTASSIUM PO Take 1 tablet by mouth daily.   Yes [provider]  Pyridoxine HCl (B-6 PO) Take 1 tablet by mouth daily.   Yes [provider]  HYDROcodone-acetaminophen (NORCO/VICODIN) 5-325 MG tablet Take 1 tablet by mouth every 6 (six) hours as needed for severe pain. Patient not taking: Reported on 04/18/2020 03/13/19   Lorin Glass, PA-C  ondansetron (ZOFRAN ODT) 4 MG disintegrating tablet Take 1 tablet (4 mg total) by mouth every 8 (  eight) hours as needed for nausea or vomiting. Patient not taking: Reported on 04/18/2020 03/13/19   Lorin Glass, PA-C    Allergies    Sulfa antibiotics  Review of Systems   Review of Systems  Gastrointestinal: Positive for abdominal pain.  All other systems reviewed and are negative.   Physical Exam Updated Vital Signs BP 132/68   Pulse 82   Temp 98.6 F (37 C) (Oral)   Resp 20   Ht 5\' 4"  (1.626 m)   Wt 67.1 kg   SpO2 100%   BMI 25.40 kg/m   Physical Exam Vitals and nursing note reviewed.  Constitutional:      General: She is not in acute distress.    Appearance: She is well-developed. She is not  diaphoretic.  HENT:     Head: Normocephalic and atraumatic.  Cardiovascular:     Rate and Rhythm: Normal rate and regular rhythm.     Heart sounds: No murmur heard.  No friction rub. No gallop.   Pulmonary:     Effort: Pulmonary effort is normal. No respiratory distress.     Breath sounds: Normal breath sounds. No wheezing.  Abdominal:     General: Bowel sounds are normal. There is no distension.     Palpations: Abdomen is soft.     Tenderness: There is abdominal tenderness in the left lower quadrant. There is no right CVA tenderness, left CVA tenderness, guarding or rebound.  Musculoskeletal:        General: Normal range of motion.     Cervical back: Normal range of motion and neck supple.  Skin:    General: Skin is warm and dry.  Neurological:     Mental Status: She is alert and oriented to person, place, and time.     ED Results / Procedures / Treatments   Labs (all labs ordered are listed, but only abnormal results are displayed) Labs Reviewed  COMPREHENSIVE METABOLIC PANEL - Abnormal; Notable for the following components:      Result Value   Potassium 3.4 (*)    Glucose, Bld 153 (*)    All other components within normal limits  CBC - Abnormal; Notable for the following components:   WBC 11.3 (*)    Platelets 415 (*)    All other components within normal limits  LIPASE, BLOOD  POC OCCULT BLOOD, ED  TYPE AND SCREEN    EKG None  Radiology No results found.  Procedures Procedures (including critical care time)  Medications Ordered in ED Medications  iohexol (OMNIPAQUE) 300 MG/ML solution 100 mL (has no administration in time range)  sodium chloride 0.9 % bolus 1,000 mL (1,000 mLs Intravenous New Bag/Given 04/18/20 1132)  ondansetron (ZOFRAN) injection 4 mg (4 mg Intravenous Given 04/18/20 1132)  morphine 4 MG/ML injection 4 mg (4 mg Intravenous Given 04/18/20 1136)    ED Course  I have reviewed the triage vital signs and the nursing notes.  Pertinent labs &  imaging results that were available during my care of the patient were reviewed by me and considered in my medical decision making (see chart for details).    MDM Rules/Calculators/A&P  Patient presenting with left-sided abdominal pain along with loose, bloody stools for the past 2 days.  She has a white count of 11,000 and hemoglobin of 14.  She is afebrile and abdomen is benign with the exception of mild tenderness to the left upper and lower quadrants.  CT scan shows colitis of the descending colon.  Patient given IV Cipro and Flagyl here, but appears appropriate for discharge.  She has been given pain medication and is feeling significantly improved.  She has had no vomiting.  Final Clinical Impression(s) / ED Diagnoses Final diagnoses:  None    Rx / DC Orders ED Discharge Orders    None       Veryl Speak, MD 04/18/20 1456

## 2020-04-18 NOTE — Discharge Instructions (Signed)
Begin taking Cipro and Flagyl as prescribed.  Take hydrocodone as prescribed as needed for pain.  Follow-up with your primary doctor and gastroenterologist in the next week, and return to the ER if you develop worsening pain, high fever, significant rectal bleeding, or other new and concerning symptoms.

## 2020-04-18 NOTE — ED Triage Notes (Signed)
EMs reports pt c/o llq pain today and has noticed bright red blood in stool this morning.  Has history of diverticulitis and IBS.  Reports n/v pta.  Pt vomited several times last night.  EMS gave 4mg  zofran and started IV fluids.  Reports cbg 140

## 2020-05-09 ENCOUNTER — Other Ambulatory Visit: Payer: Self-pay

## 2020-05-09 ENCOUNTER — Ambulatory Visit
Admission: RE | Admit: 2020-05-09 | Discharge: 2020-05-09 | Disposition: A | Discharge status: Home / Self Care | Payer: 59 | Source: Ambulatory Visit | Attending: Internal Medicine | Admitting: Internal Medicine

## 2020-05-09 DIAGNOSIS — Z1231 Encounter for screening mammogram for malignant neoplasm of breast: Secondary | ICD-10-CM

## 2020-08-13 ENCOUNTER — Ambulatory Visit
Admission: EM | Admit: 2020-08-13 | Discharge: 2020-08-13 | Disposition: A | Discharge status: Home / Self Care | Payer: Worker's Compensation | Attending: Emergency Medicine | Admitting: Emergency Medicine

## 2020-08-13 ENCOUNTER — Encounter: Payer: Self-pay | Admitting: Emergency Medicine

## 2020-08-13 ENCOUNTER — Ambulatory Visit (INDEPENDENT_AMBULATORY_CARE_PROVIDER_SITE_OTHER): Payer: Worker's Compensation

## 2020-08-13 DIAGNOSIS — M25571 Pain in right ankle and joints of right foot: Secondary | ICD-10-CM

## 2020-08-13 DIAGNOSIS — W19XXXA Unspecified fall, initial encounter: Secondary | ICD-10-CM

## 2020-08-13 MED ORDER — IBUPROFEN 600 MG PO TABS: 600.0000 mg | ORAL_TABLET | Freq: Four times a day (QID) | ORAL | 0 refills | Status: AC | PRN

## 2020-08-13 NOTE — ED Provider Notes (Signed)
[facility]   [id number] 08/13/20 Arrival Time: 88   Chief Complaint  Patient presents with  . Ankle Pain  . Fall    SUBJECTIVE: History from: patient.  Carolyn Cunningham is a 70 y.o. female who presented to the urgent care with a complaint of right ankle pain that occurred this morning.  Developed the symptom after falling.  She localizes the pain to the right ankle.  She describes the pain as constant and achy.  She has tried OTC medications without relief.  Her symptoms are made worse with ROM.  She denies similar symptoms in the past.  Denies chills, fever, nausea, vomiting, diarrhea   ROS: As per HPI.  All other pertinent ROS negative.      Past Medical History:  Diagnosis Date  . Diverticulitis   . IBS (irritable bowel syndrome)   . Renal disorder    kidney stone   Past Surgical History:  Procedure Laterality Date  . BLADDER SURGERY  04/2013   Bladder sling and tack  . BREAST EXCISIONAL BIOPSY Left 1992   benign  . BREAST SURGERY     Allergies  Allergen Reactions  . Sulfa Antibiotics Hives, Itching and Rash   No current facility-administered medications on file prior to encounter.   Current Outpatient Medications on File Prior to Encounter  Medication Sig Dispense Refill  . BIOTIN PO Take 1 tablet by mouth daily.    . Calcium Carbonate-Vitamin D (CALCIUM + D PO) Take 1 tablet by mouth daily.    . Cholecalciferol (VITAMIN D PO) Take 1 tablet by mouth daily.    . ciprofloxacin (CIPRO) 500 MG tablet Take 1 tablet (500 mg total) by mouth 2 (two) times daily. One po bid x 7 days 14 tablet 0  . fish oil-omega-3 fatty acids 1000 MG capsule Take 1 g by mouth daily.    Marland Kitchen HYDROcodone-acetaminophen (NORCO) 5-325 MG tablet Take 1-2 tablets by mouth every 6 (six) hours as needed. 15 tablet 0  . magnesium oxide (MAG-OX) 400 MG tablet Take 1 tablet by mouth daily.    . metroNIDAZOLE (FLAGYL) 500 MG tablet Take 1 tablet (500 mg total) by mouth 3 (three) times daily.  One po bid x 7 days 21 tablet 0  . Misc Natural Products (OSTEO BI-FLEX ADV JOINT SHIELD PO) Take 1 tablet by mouth daily.    . Multiple Vitamins-Minerals (ZINC PO) Take 1 tablet by mouth daily.    . ondansetron (ZOFRAN ODT) 4 MG disintegrating tablet Take 1 tablet (4 mg total) by mouth every 8 (eight) hours as needed for nausea or vomiting. (Patient not taking: Reported on 04/18/2020) 10 tablet 0  . ondansetron (ZOFRAN) 8 MG tablet Take 1 tablet (8 mg total) by mouth every 8 (eight) hours as needed for nausea or vomiting. 20 tablet 0  . POTASSIUM PO Take 1 tablet by mouth daily.    . Pyridoxine HCl (B-6 PO) Take 1 tablet by mouth daily.     Social History   Socioeconomic History  . Marital status: Married    Spouse name: Not on file  . Number of children: Not on file  . Years of education: Not on file  . Highest education level: Not on file  Occupational History  . Not on file  Tobacco Use  . Smoking status: Never Smoker  . Smokeless tobacco: Never Used  Substance and Sexual Activity  . Alcohol use: No  . Drug use: No  . Sexual activity: Never  Other  Topics Concern  . Not on file  Social History Narrative  . Not on file   Social Determinants of Health   Financial Resource Strain: Not on file  Food Insecurity: Not on file  Transportation Needs: Not on file  Physical Activity: Not on file  Stress: Not on file  Social Connections: Not on file  Intimate Partner Violence: Not on file   Family History  Problem Relation Age of Onset  . Breast cancer Neg Hx     OBJECTIVE:  Vitals:   08/13/20 1546  BP: (!) 142/76  Pulse: 74  Resp: 18  Temp: 97.7 F (36.5 C)  TempSrc: Oral  SpO2: 98%  Weight: 145 lb (65.8 kg)  Height: 5\' 4"  (1.626 m)     Physical Exam Vitals and nursing note reviewed.  Constitutional:      General: She is not in acute distress.    Appearance: Normal appearance. She is normal weight. She is not ill-appearing, toxic-appearing or diaphoretic.   HENT:     Head: Normocephalic.  Cardiovascular:     Rate and Rhythm: Normal rate and regular rhythm.     Pulses: Normal pulses.     Heart sounds: Normal heart sounds. No murmur heard. No friction rub. No gallop.   Pulmonary:     Effort: Pulmonary effort is normal. No respiratory distress.     Breath sounds: Normal breath sounds. No stridor. No wheezing, rhonchi or rales.  Chest:     Chest wall: No tenderness.  Musculoskeletal:        General: Tenderness present.     Right foot: Swelling and tenderness present.     Left foot: Normal.     Comments: The right foot is with obvious deformity when compared to the left foot.  Swelling and tenderness present.  There is no ecchymosis, open wound, lesion, warmth or surface trauma present.  Limited range of motion due to pain.  Neurovascular status intact.  Neurological:     Mental Status: She is alert and oriented to person, place, and time.    LABS:  No results found for this or any previous visit (from the past 24 hour(s)).   RADIOLOGY:  DG Ankle Complete Right  Result Date: 08/13/2020 CLINICAL DATA:  Fall.  Ankle injury. EXAM: RIGHT ANKLE - COMPLETE 3+ VIEW COMPARISON:  None. FINDINGS: Anterior and lateral soft tissue swelling. Negative for fracture. Ankle mortise intact. Ankle joint space normal. IMPRESSION: Negative for fracture.  Soft tissue swelling Electronically Signed   By: Franchot Gallo M.D.   On: 08/13/2020 16:02    Right ankle x-ray is negative for bony abnormality including fracture or dislocation.  I have reviewed the x-ray myself and the radiologist interpretation.  I am in agreement with the radiologist interpretation.  ASSESSMENT & PLAN:  1. Fall, initial encounter   2. Acute right ankle pain     Meds ordered this encounter  Medications  . ibuprofen (ADVIL) 600 MG tablet    Sig: Take 1 tablet (600 mg total) by mouth every 6 (six) hours as needed.    Dispense:  30 tablet    Refill:  0    Discharge  instructions  Ibuprofen 600 mg was prescribed for pain/take as directed with food Follow RICE instructions that is attached Follow-up with PCP Return or go to ED if you develop any new or worsening of your symptoms  Reviewed expectations re: course of current medical issues. Questions answered. Outlined signs and symptoms indicating need for more acute  intervention. Patient verbalized understanding. After Visit Summary given.         Emerson Monte, [place] 08/13/20 1655

## 2020-08-13 NOTE — ED Triage Notes (Signed)
Fell this morning.  Right ankle pain

## 2020-08-13 NOTE — Discharge Instructions (Addendum)
Ibuprofen 600 mg was prescribed for pain/take as directed with food Follow RICE instructions that is attached Follow-up with PCP Return or go to ED if you develop any new or worsening of your symptoms

## 2020-10-08 ENCOUNTER — Ambulatory Visit: Payer: Worker's Compensation

## 2020-10-08 ENCOUNTER — Other Ambulatory Visit: Payer: Self-pay

## 2020-10-08 ENCOUNTER — Other Ambulatory Visit: Payer: Self-pay | Admitting: Family Medicine

## 2020-10-08 DIAGNOSIS — M25571 Pain in right ankle and joints of right foot: Secondary | ICD-10-CM

## 2020-10-23 ENCOUNTER — Telehealth: Payer: Self-pay | Admitting: Orthopedic Surgery

## 2020-10-23 NOTE — Telephone Encounter (Signed)
Per call from Arta Bruce, [employer] insurance, ph# [phone], request for appointment for work-related right ankle injury. Notes have been received via fax, and also appear in patient's Epic chart, per Urgent care notes and [facility] provider notes. Please review and advise regarding scheduling.

## 2020-10-24 NOTE — Telephone Encounter (Signed)
Thanks  She can be scheduled to see me.    Elta Guadeloupe

## 2020-10-25 NOTE — Telephone Encounter (Signed)
Called both [employer] ph [phone] / fax# [phone], and also called patient; both are aware of appointment scheduled for 10/30/20 with Dr Amedeo Kinsman, as per his approval. Claim# [id number]

## 2020-10-31 ENCOUNTER — Ambulatory Visit: Payer: Self-pay

## 2020-10-31 ENCOUNTER — Encounter: Payer: Self-pay | Admitting: Orthopedic Surgery

## 2020-10-31 ENCOUNTER — Telehealth: Payer: Self-pay | Admitting: Radiology

## 2020-10-31 ENCOUNTER — Other Ambulatory Visit: Payer: Self-pay

## 2020-10-31 ENCOUNTER — Ambulatory Visit (INDEPENDENT_AMBULATORY_CARE_PROVIDER_SITE_OTHER): Payer: Worker's Compensation | Admitting: Orthopedic Surgery

## 2020-10-31 VITALS — BP 125/69 | HR 64 | Ht 64.0 in | Wt 154.0 lb

## 2020-10-31 DIAGNOSIS — S86301A Unspecified injury of muscle(s) and tendon(s) of peroneal muscle group at lower leg level, right leg, initial encounter: Secondary | ICD-10-CM

## 2020-10-31 DIAGNOSIS — S93491A Sprain of other ligament of right ankle, initial encounter: Secondary | ICD-10-CM

## 2020-10-31 DIAGNOSIS — S99911A Unspecified injury of right ankle, initial encounter: Secondary | ICD-10-CM

## 2020-10-31 NOTE — Progress Notes (Signed)
New Patient Visit  Assessment: Jaylinn Hellenbrand Trang is a 70 y.o. female with the following: Right ankle sprain, with peroneal tendinitis  Plan: Mrs. Sanderlin has sustained a right ankle sprain 2-3 months ago, with continued swelling and posterior lateral irritation.  On physical exam, she does have a high arch, which makes her more prone to these types of injuries.  In addition, her pain is recreated with that palpation of the peroneal tendons, as well as resisted eversion.  Multiple options including medications as needed, use of Voltaren topical gel, formal physical therapy, and possible referral for custom orthotics.  After discussing these options, the patient will start to try some Voltaren topical gel and will place a referral for physical therapy.  No follow-up is needed.  She will contact the clinic if she has any further issues.  Follow-up: Return if symptoms worsen or fail to improve.  Subjective:  Chief Complaint  Patient presents with  . Ankle Injury    Rt ankle injury after sliding down a ramp at pt house DOI: 08/13/20  Pt states pain has been back and forth and is now radiating up to her hip (feels she is compensating but no back pain)    History of Present Illness: Kalan Rinn Armistead is a 70 y.o. female who presents for evaluation of right ankle pain.  She states that she sprained her ankle at the end of January, 2022.  She slipped on some ice walking up a ramp.  She noted immediate pain in her right ankle.  She also had significant swelling around her ankle.  This can steadily improved, and she was doing well.  However, she started to experience an increase in swelling, as well as pain in the posterior lateral aspect of her ankle.  She states the ankle is stiff in the morning, but starts to loosen up.  However, the longer she is on her feet, the worst the pain around her ankle gets.  She states she has a history of multiple ankle sprains as a child, but none recently.  She is not taking  medications on a consistent basis for her pain.   Review of Systems: No fevers or chills No numbness or tingling No chest pain No shortness of breath No bowel or bladder dysfunction No GI distress No headaches   Medical History:  Past Medical History:  Diagnosis Date  . Diverticulitis   . IBS (irritable bowel syndrome)   . Renal disorder    kidney stone    Past Surgical History:  Procedure Laterality Date  . BLADDER SURGERY  04/2013   Bladder sling and tack  . BREAST EXCISIONAL BIOPSY Left 1992   benign  . BREAST SURGERY      Family History  Problem Relation Age of Onset  . Breast cancer Neg Hx    Social History   Tobacco Use  . Smoking status: Never Smoker  . Smokeless tobacco: Never Used  Substance Use Topics  . Alcohol use: No  . Drug use: No    Allergies  Allergen Reactions  . Sulfa Antibiotics Hives, Itching and Rash    Current Meds  Medication Sig  . BIOTIN PO Take 1 tablet by mouth daily.  . Calcium Carbonate-Vitamin D (CALCIUM + D PO) Take 1 tablet by mouth daily.  . Cholecalciferol (VITAMIN D PO) Take 1 tablet by mouth daily.  . fish oil-omega-3 fatty acids 1000 MG capsule Take 1 g by mouth daily.  Marland Kitchen ibuprofen (ADVIL) 600 MG tablet Take  1 tablet (600 mg total) by mouth every 6 (six) hours as needed.  . magnesium oxide (MAG-OX) 400 MG tablet Take 1 tablet by mouth daily.  . Misc Natural Products (OSTEO BI-FLEX ADV JOINT SHIELD PO) Take 1 tablet by mouth daily.  . Multiple Vitamins-Minerals (ZINC PO) Take 1 tablet by mouth daily.  Marland Kitchen POTASSIUM PO Take 1 tablet by mouth daily.  . Pyridoxine HCl (B-6 PO) Take 1 tablet by mouth daily.    Objective: BP 125/69   Pulse 64   Ht 5\' 4"  (1.626 m)   Wt 154 lb (69.9 kg)   BMI 26.43 kg/m   Physical Exam:  General: Alert and oriented.  No acute distress. Gait: Normal, wearing flip-flops.  Evaluation of the right ankle demonstrates a mild cavus deformity.  Mild residual swelling over the anterior  lateral aspect of her ankle.  This is nontender to palpation.  Resisted eversion causes pain in the posterior lateral aspect of her ankle.  She is tender to palpation along the breadth of the peroneal tendons.  No tenderness to palpation at the base of the fifth metatarsal.  Sensation is intact over the dorsum of the foot.  Toes are warm and well-perfused    IMAGING: I personally ordered and reviewed the following images   X-rays of the right ankle were obtained in clinic today and demonstrates no acute injury.  The mortise remains intact.  There is no syndesmotic disruption.  Impression: Normal right ankle x-ray   New Medications:  No orders of the defined types were placed in this encounter.     Mordecai Rasmussen, MD  10/31/2020 12:58 PM

## 2020-10-31 NOTE — Telephone Encounter (Signed)
Order in system for physical therapy, will you call for approval? Workers comp may choose where they want her to go.

## 2020-10-31 NOTE — Telephone Encounter (Signed)
Yes, will enter a note into referral once sent (was awaiting dictation).

## 2020-10-31 NOTE — Patient Instructions (Signed)
Referral to PT  Try voltaren gel on lateral ankle  If not improving, can consider a custom orthotic

## 2020-11-20 ENCOUNTER — Ambulatory Visit: Payer: Self-pay | Admitting: Orthopaedic Surgery

## 2020-12-25 DIAGNOSIS — Z1283 Encounter for screening for malignant neoplasm of skin: Secondary | ICD-10-CM | POA: Diagnosis not present

## 2020-12-25 DIAGNOSIS — X32XXXD Exposure to sunlight, subsequent encounter: Secondary | ICD-10-CM | POA: Diagnosis not present

## 2020-12-25 DIAGNOSIS — L570 Actinic keratosis: Secondary | ICD-10-CM | POA: Diagnosis not present

## 2020-12-25 DIAGNOSIS — D225 Melanocytic nevi of trunk: Secondary | ICD-10-CM | POA: Diagnosis not present

## 2021-02-01 DIAGNOSIS — Z0100 Encounter for examination of eyes and vision without abnormal findings: Secondary | ICD-10-CM | POA: Diagnosis not present

## 2021-02-01 DIAGNOSIS — H5200 Hypermetropia, unspecified eye: Secondary | ICD-10-CM | POA: Diagnosis not present

## 2021-02-27 ENCOUNTER — Other Ambulatory Visit: Payer: Self-pay

## 2021-02-27 ENCOUNTER — Emergency Department (HOSPITAL_COMMUNITY): Payer: Medicare HMO

## 2021-02-27 ENCOUNTER — Encounter: Payer: Self-pay | Admitting: Emergency Medicine

## 2021-02-27 ENCOUNTER — Encounter (HOSPITAL_COMMUNITY): Payer: Self-pay

## 2021-02-27 ENCOUNTER — Emergency Department (HOSPITAL_COMMUNITY)
Admission: EM | Admit: 2021-02-27 | Discharge: 2021-02-27 | Disposition: A | Discharge status: Home / Self Care | Payer: Medicare HMO | Attending: Emergency Medicine | Admitting: Emergency Medicine

## 2021-02-27 ENCOUNTER — Ambulatory Visit
Admission: EM | Admit: 2021-02-27 | Discharge: 2021-02-27 | Disposition: A | Discharge status: Home / Self Care | Payer: Medicare HMO | Attending: Family Medicine | Admitting: Family Medicine

## 2021-02-27 DIAGNOSIS — R0789 Other chest pain: Secondary | ICD-10-CM | POA: Insufficient documentation

## 2021-02-27 DIAGNOSIS — Z20822 Contact with and (suspected) exposure to covid-19: Secondary | ICD-10-CM | POA: Diagnosis not present

## 2021-02-27 DIAGNOSIS — R002 Palpitations: Secondary | ICD-10-CM | POA: Insufficient documentation

## 2021-02-27 DIAGNOSIS — R079 Chest pain, unspecified: Secondary | ICD-10-CM

## 2021-02-27 LAB — CBC
HCT: 40.9 % (ref 36.0–46.0)
Hemoglobin: 14 g/dL (ref 12.0–15.0)
MCH: 31.4 pg (ref 26.0–34.0)
MCHC: 34.2 g/dL (ref 30.0–36.0)
MCV: 91.7 fL (ref 80.0–100.0)
Platelets: 382 10*3/uL (ref 150–400)
RBC: 4.46 MIL/uL (ref 3.87–5.11)
RDW: 13.8 % (ref 11.5–15.5)
WBC: 5.6 10*3/uL (ref 4.0–10.5)
nRBC: 0 % (ref 0.0–0.2)

## 2021-02-27 LAB — BASIC METABOLIC PANEL
Anion gap: 7 (ref 5–15)
BUN: 10 mg/dL (ref 8–23)
CO2: 26 mmol/L (ref 22–32)
Calcium: 9.6 mg/dL (ref 8.9–10.3)
Chloride: 105 mmol/L (ref 98–111)
Creatinine, Ser: 0.52 mg/dL (ref 0.44–1.00)
GFR, Estimated: >60 mL/min (ref >60)
Glucose, Bld: 96 mg/dL (ref 70–99)
Potassium: 3.5 mmol/L (ref 3.5–5.1)
Sodium: 138 mmol/L (ref 135–145)

## 2021-02-27 LAB — TROPONIN I (HIGH SENSITIVITY)
Troponin I (High Sensitivity): 4 ng/L (ref <18)
Troponin I (High Sensitivity): 4 ng/L (ref <18)

## 2021-02-27 LAB — URINALYSIS, ROUTINE W REFLEX MICROSCOPIC
Bilirubin Urine: NEGATIVE
Glucose, UA: NEGATIVE mg/dL
Hgb urine dipstick: NEGATIVE
Ketones, ur: NEGATIVE mg/dL
Leukocytes,Ua: NEGATIVE
Nitrite: NEGATIVE
Protein, ur: NEGATIVE mg/dL
Specific Gravity, Urine: 1.009 (ref 1.005–1.030)
pH: 8 (ref 5.0–8.0)

## 2021-02-27 LAB — RESP PANEL BY RT-PCR (FLU A&B, COVID) ARPGX2
Influenza A by PCR: NEGATIVE
Influenza B by PCR: NEGATIVE
SARS Coronavirus 2 by RT PCR: NEGATIVE

## 2021-02-27 NOTE — Discharge Instructions (Addendum)
You may take Tylenol every 4 hours if needed for pain.  You may try applying ice packs on and off to your chest.  I recommend that you contact the cardiologist listed to arrange a follow-up appointment.  Also, return to the emergency department if you develop any new or worsening symptoms.

## 2021-02-27 NOTE — Discharge Instructions (Addendum)
You have been seen at the [facility] today for chest pain. Your ECG (heart tracing) did not show any worrisome changes. However, some medical problems make take more time to appear. Therefore, it's very important that you pay attention to any new symptoms or worsening of your current condition.  Please proceed directly to the Emergency Department immediately should you feel worse in any way or have any of the following symptoms: increasing or different chest pain, shortness of breath, or nausea and vomiting.

## 2021-02-27 NOTE — ED Provider Notes (Signed)
[facility] EMERGENCY DEPARTMENT Provider Note   CSN: [id number] Arrival date & time: 02/27/21  1033     History Chief Complaint  Patient presents with   Chest Pain    Carolyn Cunningham is a 70 y.o. female.   Chest Pain Associated symptoms: palpitations   Associated symptoms: no abdominal pain, no dizziness, no fever, no headache, no nausea, no numbness, no shortness of breath, no vomiting and no weakness        Carolyn Cunningham is a 70 y.o. female with no significant past medical history of heart disease, presents to the Emergency Department complaining of very focal area of tenderness of her left upper chest wall, intermittent episodes of feeling that her heart is racing and accompanied with heaviness of her left chest that radiates to her left shoulder.  Symptoms been present for 1 week.  At times, she states that she feels a little "off balance" that is also intermittent.  She denies feeling dizzy or sense of movement with position change.  States her symptoms began shortly after lifting heavy bags of gravel.  Pain of her left upper chest is also present with bending and stretching.  She denies any shortness of breath.  No history of palpitations or prior cardiac disease.  She denies any nausea, vomiting, fever, chills, and cough.    Past Medical History:  Diagnosis Date   Diverticulitis    IBS (irritable bowel syndrome)    Renal disorder    kidney stone    Patient Active Problem List   Diagnosis Date Noted   Diverticulosis 03/13/2019   Calculus of gallbladder without cholecystitis without obstruction 03/13/2019   Family history of colon cancer 04/01/2017   Lumbar disc disease 04/01/2017   Tubular adenoma 04/01/2017    Past Surgical History:  Procedure Laterality Date   BLADDER SURGERY  04/2013   Bladder sling and tack   BREAST EXCISIONAL BIOPSY Left 1992   benign   BREAST SURGERY       OB History   No obstetric history on file.     Family History  Problem  Relation Age of Onset   Breast cancer Neg Hx     Social History   Tobacco Use   Smoking status: Never   Smokeless tobacco: Never  Substance Use Topics   Alcohol use: No   Drug use: No    Home Medications Prior to Admission medications   Medication Sig Start Date End Date Taking? Authorizing Provider  BIOTIN PO Take 1 tablet by mouth daily.   Yes [provider]  Calcium Carbonate-Vitamin D (CALCIUM + D PO) Take 1 tablet by mouth daily.   Yes [provider]  Cholecalciferol (VITAMIN D PO) Take 1 tablet by mouth daily.   Yes [provider]  fish oil-omega-3 fatty acids 1000 MG capsule Take 1 g by mouth daily.   Yes [provider]  magnesium oxide (MAG-OX) 400 MG tablet Take 1 tablet by mouth daily.   Yes [provider]  Misc Natural Products (OSTEO BI-FLEX ADV JOINT SHIELD PO) Take 1 tablet by mouth daily.   Yes [provider]  Multiple Vitamins-Minerals (ZINC PO) Take 1 tablet by mouth daily.   Yes [provider]  POTASSIUM PO Take 1 tablet by mouth daily.   Yes [provider]  TURMERIC PO Take 1 tablet by mouth daily.   Yes [provider]  ciprofloxacin (CIPRO) 500 MG tablet Take 1 tablet (500 mg total) by mouth  2 (two) times daily. One po bid x 7 days Patient not taking: No sig reported 04/18/20   Veryl Speak, MD  HYDROcodone-acetaminophen (NORCO) 5-325 MG tablet Take 1-2 tablets by mouth every 6 (six) hours as needed. Patient not taking: No sig reported 04/18/20   Veryl Speak, MD  ibuprofen (ADVIL) 600 MG tablet Take 1 tablet (600 mg total) by mouth every 6 (six) hours as needed. Patient not taking: Reported on 02/27/2021 08/13/20   Emerson Monte, FNP  metroNIDAZOLE (FLAGYL) 500 MG tablet Take 1 tablet (500 mg total) by mouth 3 (three) times daily. One po bid x 7 days Patient not taking: No sig reported 04/18/20   Veryl Speak, MD  ondansetron (ZOFRAN ODT) 4 MG disintegrating tablet Take  1 tablet (4 mg total) by mouth every 8 (eight) hours as needed for nausea or vomiting. Patient not taking: No sig reported 03/13/19   Lorin Glass, PA-C  ondansetron (ZOFRAN) 8 MG tablet Take 1 tablet (8 mg total) by mouth every 8 (eight) hours as needed for nausea or vomiting. Patient not taking: No sig reported 04/18/20   Daleen Bo, MD    Allergies    Sulfa antibiotics  Review of Systems   Review of Systems  Constitutional:  Negative for chills and fever.  Respiratory:  Negative for chest tightness and shortness of breath.   Cardiovascular:  Positive for chest pain and palpitations. Negative for leg swelling.  Gastrointestinal:  Negative for abdominal pain, nausea and vomiting.  Genitourinary:  Negative for flank pain.  Musculoskeletal:  Negative for neck pain.  Skin:  Negative for rash.  Neurological:  Negative for dizziness, syncope, weakness, numbness and headaches.  Psychiatric/Behavioral:  Negative for confusion.    Physical Exam Updated Vital Signs BP 130/67   Pulse 63   Temp 98.1 F (36.7 C) (Oral)   Resp (!) 21   Ht '5\' 4"'$  (1.626 m)   Wt 70.3 kg   SpO2 100%   BMI 26.61 kg/m   Physical Exam Vitals and nursing note reviewed.  Constitutional:      General: She is not in acute distress.    Appearance: She is well-developed. She is not toxic-appearing.  HENT:     Mouth/Throat:     Mouth: Mucous membranes are moist.  Cardiovascular:     Rate and Rhythm: Normal rate and regular rhythm.     Pulses: Normal pulses.  Pulmonary:     Effort: Pulmonary effort is normal. No respiratory distress.     Breath sounds: Normal breath sounds.  Chest:     Chest wall: Tenderness (quarter sized area of tenderness to the left chest.  reproduces with movement and deep breathing.) present.  Musculoskeletal:     Cervical back: Normal range of motion.     Right lower leg: No edema.     Left lower leg: No edema.  Skin:    General: Skin is warm.     Capillary Refill:  Capillary refill takes less than 2 seconds.     Findings: No rash.  Neurological:     General: No focal deficit present.     Mental Status: She is alert.     Sensory: No sensory deficit.     Motor: No weakness.    ED Results / Procedures / Treatments   Labs (all labs ordered are listed, but only abnormal results are displayed) Labs Reviewed  URINALYSIS, ROUTINE W REFLEX MICROSCOPIC - Abnormal; Notable for the following components:  Result Value   APPearance CLOUDY (*)    All other components within normal limits  RESP PANEL BY RT-PCR (FLU A&B, COVID) ARPGX2  BASIC METABOLIC PANEL  CBC  TROPONIN I (HIGH SENSITIVITY)  TROPONIN I (HIGH SENSITIVITY)    EKG EKG Interpretation  Date/Time:  Wednesday February 27 2021 10:45:21 EDT Ventricular Rate:  66 PR Interval:  116 QRS Duration: 76 QT Interval:  416 QTC Calculation: 436 R Axis:   69 Text Interpretation: Normal sinus rhythm Normal ECG Confirmed by Fredia Sorrow [phone]) on 02/27/2021 2:36:29 PM  Radiology DG Chest 2 View  Result Date: 02/27/2021 CLINICAL DATA:  Chest pain. EXAM: CHEST - 2 VIEW COMPARISON:  None. FINDINGS: Mildly prominent lung markings without consolidation. No visible pleural effusions or pneumothorax. Borderline enlargement the cardiac silhouette. Osteopenia with degenerative changes of the thoracic spine. Mild reverse S-shaped thoracolumbar curvature. Rounded calcification in the right upper quadrant, likely a gallstone. IMPRESSION: No evidence of acute cardiopulmonary disease. Suspected cholelithiasis. Electronically Signed   By: Margaretha Sheffield MD   On: 02/27/2021 11:41    Procedures Procedures   Medications Ordered in ED Medications - No data to display  ED Course  I have reviewed the triage vital signs and the nursing notes.  Pertinent labs & imaging results that were available during my care of the patient were reviewed by me and considered in my medical decision making (see chart for  details).    MDM Rules/Calculators/A&P                           Patient here with intermittent focal left upper chest wall pain.  States that her chest pain is also associated with feelings of palpitations.  Symptoms have been intermittent.  Denies symptoms here.  Her symptoms began after lifting heavy bags of gravel.  No cardiac history or history of palpitations.  Well-appearing.  Nontoxic appearing.  No known COVID exposures.  Doubt PE.  Labs interpreted by me, urinalysis without evidence of infection.  COVID-negative.  Chemistries unremarkable, no leukocytosis.  EKG reviewed by Dr. Rogene Houston without evidence of acute ischemic change.  Vital signs reassuring.  Low clinical suspicion for PE as chest pain is very focal patient points to area with 1 finger as source of her pain.  She can also reproduce her pain with bending over.  Given onset after heavy lifting I suspect this is likely musculoskeletal, given that she is also complained of palpitations she may benefit from follow-up from cardiology as she may need Holter monitoring.  Patient is agreeable to this plan.  Feel that she is appropriate for discharge home.  All questions answered and she has been given strict return precautions.    Final Clinical Impression(s) / ED Diagnoses Final diagnoses:  Atypical chest pain  Palpitation    Rx / DC Orders ED Discharge Orders     None        Kem Parkinson, PA-C 02/27/21 1509    Fredia Sorrow, MD 03/01/21 1614

## 2021-02-27 NOTE — ED Triage Notes (Signed)
Pt presents to ED with complaints of intermittent left sided chest heaviness which radiates into left shoulder, started last Wednesday. Pt states she also feels "off balance"

## 2021-02-27 NOTE — ED Triage Notes (Addendum)
Pt reports "drawing feeling " to LT chest that is on and off since last Wednesday that is sore to palpation and leaning forward.   Sometimes makes her LT shoulder feel heavy when trying to raise it.

## 2021-03-02 NOTE — ED Provider Notes (Signed)
[place]   [id number] 02/27/21 Arrival Time: [id number]  ASSESSMENT & PLAN:  1. Chest pain, unspecified type    Discussed that I cannot f/o cardiac cause of her chest pain though symptoms do sound MSK related except for radiation to neck and L arm. Normal neuro exam.   ECG: Performed today and interpreted by me: sinus brady; no STEMI or signs of ischemia.    Discharge Instructions      You have been seen at the [facility] today for chest pain. Your ECG (heart tracing) did not show any worrisome changes. However, some medical problems make take more time to appear. Therefore, it's very important that you pay attention to any new symptoms or worsening of your current condition.  Please proceed directly to the Emergency Department immediately should you feel worse in any way or have any of the following symptoms: increasing or different chest pain, shortness of breath, or nausea and vomiting.      Reviewed expectations re: course of current medical issues. Questions answered. Outlined signs and symptoms indicating need for more acute intervention. Patient verbalized understanding. After Visit Summary given.   SUBJECTIVE:  History from: patient. Carolyn Cunningham is a 70 y.o. female who reports with L upper chest pain; intermittent; has noted episodes over past couple of weeks; random; last seconds to a minute with grad resolution; questions if movements exacerbate. Pain does radiate to L neck and down L arm. No extremity sensation changes or weakness. No assoc SOB/n/v/diaphoreses. Symptoms do not wake her at night. No tx PTA. Denies street drug use.  Social History   Tobacco Use  Smoking Status Never  Smokeless Tobacco Never   Social History   Substance and Sexual Activity  Alcohol Use No     OBJECTIVE:  Vitals:   02/27/21 0950  BP: (!) 146/77  Pulse: 63  Resp: 18  Temp: 98.4 F (36.9 C)  TempSrc: Oral  SpO2: 96%    General appearance:  alert, oriented, no acute distress Eyes: PERRLA; EOMI; conjunctivae normal HENT: normocephalic; atraumatic Neck: supple with FROM Lungs: without labored respirations; speaks full sentences without difficulty; CTAB Heart: regular Chest Wall: with mild tenderness to palpation over L upper chest Abdomen: soft, non-tender; no guarding or rebound tenderness Extremities: without edema; without calf swelling or tenderness; symmetrical without gross deformities Skin: warm and dry; without rash or lesions Neuro: normal gait Psychological: alert and cooperative; normal mood and affect    Allergies  Allergen Reactions   Sulfa Antibiotics Hives, Itching and Rash    Past Medical History:  Diagnosis Date   Diverticulitis    IBS (irritable bowel syndrome)    Renal disorder    kidney stone   Social History   Socioeconomic History   Marital status: Married    Spouse name: Not on file   Number of children: Not on file   Years of education: Not on file   Highest education level: Not on file  Occupational History   Not on file  Tobacco Use   Smoking status: Never   Smokeless tobacco: Never  Substance and Sexual Activity   Alcohol use: No   Drug use: No   Sexual activity: Never  Other Topics Concern   Not on file  Social History Narrative   Not on file   Social Determinants of Health   Financial Resource Strain: Not on file  Food Insecurity: Not on file  Transportation Needs: Not on file  Physical Activity: Not on  file  Stress: Not on file  Social Connections: Not on file  Intimate Partner Violence: Not on file   Family History  Problem Relation Age of Onset   Breast cancer Neg Hx    Past Surgical History:  Procedure Laterality Date   BLADDER SURGERY  04/2013   Bladder sling and tack   BREAST EXCISIONAL BIOPSY Left 1992   benign   BREAST SURGERY        Vanessa Kick, MD 03/02/21 1018

## 2021-03-07 DIAGNOSIS — R0789 Other chest pain: Secondary | ICD-10-CM | POA: Diagnosis not present

## 2021-03-08 ENCOUNTER — Other Ambulatory Visit: Payer: Self-pay | Admitting: Obstetrics and Gynecology

## 2021-03-08 DIAGNOSIS — Z01419 Encounter for gynecological examination (general) (routine) without abnormal findings: Secondary | ICD-10-CM | POA: Diagnosis not present

## 2021-03-08 DIAGNOSIS — Z124 Encounter for screening for malignant neoplasm of cervix: Secondary | ICD-10-CM | POA: Diagnosis not present

## 2021-03-08 DIAGNOSIS — Z1231 Encounter for screening mammogram for malignant neoplasm of breast: Secondary | ICD-10-CM

## 2021-03-08 DIAGNOSIS — Z1211 Encounter for screening for malignant neoplasm of colon: Secondary | ICD-10-CM | POA: Diagnosis not present

## 2021-03-18 DIAGNOSIS — Z124 Encounter for screening for malignant neoplasm of cervix: Secondary | ICD-10-CM | POA: Diagnosis not present

## 2021-03-18 DIAGNOSIS — Z01419 Encounter for gynecological examination (general) (routine) without abnormal findings: Secondary | ICD-10-CM | POA: Diagnosis not present

## 2021-03-18 DIAGNOSIS — Z1211 Encounter for screening for malignant neoplasm of colon: Secondary | ICD-10-CM | POA: Diagnosis not present

## 2021-04-11 DIAGNOSIS — Z0000 Encounter for general adult medical examination without abnormal findings: Secondary | ICD-10-CM | POA: Diagnosis not present

## 2021-04-18 DIAGNOSIS — Z79899 Other long term (current) drug therapy: Secondary | ICD-10-CM | POA: Diagnosis not present

## 2021-04-18 DIAGNOSIS — Z23 Encounter for immunization: Secondary | ICD-10-CM | POA: Diagnosis not present

## 2021-04-18 DIAGNOSIS — Z0000 Encounter for general adult medical examination without abnormal findings: Secondary | ICD-10-CM | POA: Diagnosis not present

## 2021-05-10 ENCOUNTER — Other Ambulatory Visit: Payer: Self-pay

## 2021-05-10 ENCOUNTER — Ambulatory Visit
Admission: RE | Admit: 2021-05-10 | Discharge: 2021-05-10 | Disposition: A | Discharge status: Home / Self Care | Payer: Medicare HMO | Source: Ambulatory Visit | Attending: Obstetrics and Gynecology | Admitting: Obstetrics and Gynecology

## 2021-05-10 DIAGNOSIS — Z1231 Encounter for screening mammogram for malignant neoplasm of breast: Secondary | ICD-10-CM | POA: Diagnosis not present

## 2021-06-25 DIAGNOSIS — X32XXXD Exposure to sunlight, subsequent encounter: Secondary | ICD-10-CM | POA: Diagnosis not present

## 2021-06-25 DIAGNOSIS — L570 Actinic keratosis: Secondary | ICD-10-CM | POA: Diagnosis not present

## 2021-06-25 DIAGNOSIS — L308 Other specified dermatitis: Secondary | ICD-10-CM | POA: Diagnosis not present

## 2021-06-25 DIAGNOSIS — Z1283 Encounter for screening for malignant neoplasm of skin: Secondary | ICD-10-CM | POA: Diagnosis not present

## 2021-07-04 ENCOUNTER — Other Ambulatory Visit: Payer: Self-pay

## 2021-07-04 ENCOUNTER — Ambulatory Visit
Admission: EM | Admit: 2021-07-04 | Discharge: 2021-07-04 | Disposition: A | Discharge status: Home / Self Care | Payer: Medicare HMO | Attending: Urgent Care | Admitting: Urgent Care

## 2021-07-04 ENCOUNTER — Encounter: Payer: Self-pay | Admitting: Emergency Medicine

## 2021-07-04 DIAGNOSIS — R070 Pain in throat: Secondary | ICD-10-CM | POA: Diagnosis not present

## 2021-07-04 DIAGNOSIS — Z20822 Contact with and (suspected) exposure to covid-19: Secondary | ICD-10-CM | POA: Diagnosis not present

## 2021-07-04 DIAGNOSIS — J069 Acute upper respiratory infection, unspecified: Secondary | ICD-10-CM | POA: Diagnosis not present

## 2021-07-04 LAB — POCT RAPID STREP A (OFFICE): Rapid Strep A Screen: NEGATIVE

## 2021-07-04 MED ORDER — CETIRIZINE HCL 10 MG PO TABS: 10.0000 mg | ORAL_TABLET | Freq: Every day | ORAL | 0 refills | Status: DC

## 2021-07-04 MED ORDER — PSEUDOEPHEDRINE HCL 60 MG PO TABS: 60.0000 mg | ORAL_TABLET | Freq: Three times a day (TID) | ORAL | 0 refills | Status: DC | PRN

## 2021-07-04 MED ORDER — CHLORASEPTIC 1.4 % MT LIQD: 1.0000 | Freq: Three times a day (TID) | OROMUCOSAL | 0 refills | Status: DC | PRN

## 2021-07-04 NOTE — ED Provider Notes (Signed)
[place]   MRN: [id number] DOB: [birth year]  Subjective:   Carolyn Cunningham is a 70 y.o. female presenting for 4-day history of acute onset persistent moderate to severe throat pain with painful swallowing, runny and stuffy nose, intermittent bilateral ear pain, chills.  Has had a minimal cough.  No fevers, sinus pain, ear drainage, tinnitus, chest pain, shortness of breath, wheezing.  Patient has been using Alka-Seltzer with minimal relief.  No current facility-administered medications for this encounter.  Current Outpatient Medications:    BIOTIN PO, Take 1 tablet by mouth daily., Disp: , Rfl:    Calcium Carbonate-Vitamin D (CALCIUM + D PO), Take 1 tablet by mouth daily., Disp: , Rfl:    Cholecalciferol (VITAMIN D PO), Take 1 tablet by mouth daily., Disp: , Rfl:    ciprofloxacin (CIPRO) 500 MG tablet, Take 1 tablet (500 mg total) by mouth 2 (two) times daily. One po bid x 7 days (Patient not taking: No sig reported), Disp: 14 tablet, Rfl: 0   fish oil-omega-3 fatty acids 1000 MG capsule, Take 1 g by mouth daily., Disp: , Rfl:    HYDROcodone-acetaminophen (NORCO) 5-325 MG tablet, Take 1-2 tablets by mouth every 6 (six) hours as needed. (Patient not taking: No sig reported), Disp: 15 tablet, Rfl: 0   ibuprofen (ADVIL) 600 MG tablet, Take 1 tablet (600 mg total) by mouth every 6 (six) hours as needed. (Patient not taking: Reported on 02/27/2021), Disp: 30 tablet, Rfl: 0   magnesium oxide (MAG-OX) 400 MG tablet, Take 1 tablet by mouth daily., Disp: , Rfl:    metroNIDAZOLE (FLAGYL) 500 MG tablet, Take 1 tablet (500 mg total) by mouth 3 (three) times daily. One po bid x 7 days (Patient not taking: No sig reported), Disp: 21 tablet, Rfl: 0   Misc Natural Products (OSTEO BI-FLEX ADV JOINT SHIELD PO), Take 1 tablet by mouth daily., Disp: , Rfl:    Multiple Vitamins-Minerals (ZINC PO), Take 1 tablet by mouth daily., Disp: , Rfl:    ondansetron (ZOFRAN ODT) 4 MG disintegrating tablet,  Take 1 tablet (4 mg total) by mouth every 8 (eight) hours as needed for nausea or vomiting. (Patient not taking: No sig reported), Disp: 10 tablet, Rfl: 0   ondansetron (ZOFRAN) 8 MG tablet, Take 1 tablet (8 mg total) by mouth every 8 (eight) hours as needed for nausea or vomiting. (Patient not taking: No sig reported), Disp: 20 tablet, Rfl: 0   POTASSIUM PO, Take 1 tablet by mouth daily., Disp: , Rfl:    TURMERIC PO, Take 1 tablet by mouth daily., Disp: , Rfl:    Allergies  Allergen Reactions   Sulfa Antibiotics Hives, Itching and Rash    Past Medical History:  Diagnosis Date   Diverticulitis    IBS (irritable bowel syndrome)    Renal disorder    kidney stone     Past Surgical History:  Procedure Laterality Date   BLADDER SURGERY  04/2013   Bladder sling and tack   BREAST EXCISIONAL BIOPSY Left 1992   benign   BREAST SURGERY      Family History  Problem Relation Age of Onset   Breast cancer Neg Hx     Social History   Tobacco Use   Smoking status: Never   Smokeless tobacco: Never  Substance Use Topics   Alcohol use: No   Drug use: No    ROS   Objective:   Vitals: BP 139/77 (BP Location: Right Arm)    Pulse  85    Temp 98.9 F (37.2 C) (Oral)    Resp 18    SpO2 95%   Physical Exam Constitutional:      General: She is not in acute distress.    Appearance: She is well-developed. She is not ill-appearing, toxic-appearing or diaphoretic.  HENT:     Head: Normocephalic and atraumatic.     Right Ear: Tympanic membrane and ear canal normal. No drainage or tenderness. No middle ear effusion. Tympanic membrane is not erythematous.     Left Ear: Tympanic membrane and ear canal normal. No drainage or tenderness.  No middle ear effusion. Tympanic membrane is not erythematous.     Nose: Congestion present. No rhinorrhea.     Mouth/Throat:     Mouth: Mucous membranes are moist. No oral lesions.     Pharynx: No pharyngeal swelling, oropharyngeal exudate, posterior  oropharyngeal erythema or uvula swelling.     Tonsils: No tonsillar exudate or tonsillar abscesses.     Comments: Thick wall of postnasal drainage overlying pharynx. Eyes:     General: No scleral icterus.       Right eye: No discharge.        Left eye: No discharge.     Extraocular Movements: Extraocular movements intact.     Right eye: Normal extraocular motion.     Left eye: Normal extraocular motion.     Conjunctiva/sclera: Conjunctivae normal.     Pupils: Pupils are equal, round, and reactive to light.  Cardiovascular:     Rate and Rhythm: Normal rate.  Pulmonary:     Effort: Pulmonary effort is normal.  Musculoskeletal:     Cervical back: Normal range of motion and neck supple. No rigidity or tenderness.  Lymphadenopathy:     Cervical: No cervical adenopathy.  Skin:    General: Skin is warm and dry.  Neurological:     General: No focal deficit present.     Mental Status: She is alert and oriented to person, place, and time.  Psychiatric:        Mood and Affect: Mood normal.        Behavior: Behavior normal.    Results for orders placed or performed during the hospital encounter of 07/04/21 (from the past 24 hour(s))  POCT rapid strep A     Status: None   Collection Time: 07/04/21  8:25 AM  Result Value Ref Range   Rapid Strep A Screen Negative Negative    Assessment and Plan :   PDMP not reviewed this encounter.  1. Viral upper respiratory infection   2. Exposure to COVID-19 virus   3. Throat pain    Strep culture, COVID and flu test pending. Suspect viral URI, viral pharyngitis, viral syndrome. Physical exam findings reassuring and vital signs stable for discharge. Advised supportive care, offered symptomatic relief. Counseled patient on potential for adverse effects with medications prescribed/recommended today, ER and return-to-clinic precautions discussed, patient verbalized understanding.     [name], [place] 07/04/21 [id number]

## 2021-07-04 NOTE — ED Triage Notes (Signed)
Symptoms started on Monday.  Sore throat, states she can't swallow. States ears hurts, chills.

## 2021-07-04 NOTE — Discharge Instructions (Addendum)
We will notify you of your test results as they arrive and may take between 48-72 hours.  I encourage you to sign up for MyChart if you have not already done so as this can be the easiest way for [place] to communicate results to you online or through a phone app.  Generally, we only contact you if it is a positive test result.  In the meantime, if you develop worsening symptoms including fever, chest pain, shortness of breath despite our current treatment plan then please report to the emergency room as this may be a sign of worsening status from possible viral infection.  Otherwise, we will manage this as a viral syndrome. For sore throat or cough try using a honey-based tea. Use 3 teaspoons of honey with juice squeezed from half lemon. Place shaved pieces of ginger into 1/2-1 cup of water and warm over stove top. Then mix the ingredients and repeat every 4 hours as needed. Please take Tylenol 500mg -650mg  every 6 hours for aches and pains, fevers. Hydrate very well with at least 2 liters of water. Eat light meals such as soups to replenish electrolytes and soft fruits, veggies. Start an antihistamine like Zyrtec for postnasal drainage, sinus congestion.  You can take this together with pseudoephedrine (Sudafed) at a dose of 60 mg 2-3 times a day as needed for the same kind of congestion.  Use the throat spray for local pain relief.

## 2021-07-05 LAB — COVID-19, FLU A+B NAA
Influenza A, NAA: NOT DETECTED
Influenza B, NAA: NOT DETECTED
SARS-CoV-2, NAA: DETECTED — AB

## 2021-07-07 LAB — CULTURE, GROUP A STREP (THRC)

## 2021-08-06 DIAGNOSIS — K2100 Gastro-esophageal reflux disease with esophagitis, without bleeding: Secondary | ICD-10-CM | POA: Diagnosis not present

## 2021-08-06 DIAGNOSIS — R5383 Other fatigue: Secondary | ICD-10-CM | POA: Diagnosis not present

## 2021-08-06 DIAGNOSIS — Z0000 Encounter for general adult medical examination without abnormal findings: Secondary | ICD-10-CM | POA: Diagnosis not present

## 2021-08-06 DIAGNOSIS — M65341 Trigger finger, right ring finger: Secondary | ICD-10-CM | POA: Diagnosis not present

## 2021-08-20 DIAGNOSIS — M720 Palmar fascial fibromatosis [Dupuytren]: Secondary | ICD-10-CM | POA: Diagnosis not present

## 2021-08-20 DIAGNOSIS — M25541 Pain in joints of right hand: Secondary | ICD-10-CM | POA: Diagnosis not present

## 2021-08-20 DIAGNOSIS — M65341 Trigger finger, right ring finger: Secondary | ICD-10-CM | POA: Diagnosis not present

## 2021-11-01 DIAGNOSIS — M9901 Segmental and somatic dysfunction of cervical region: Secondary | ICD-10-CM | POA: Diagnosis not present

## 2021-11-01 DIAGNOSIS — M542 Cervicalgia: Secondary | ICD-10-CM | POA: Diagnosis not present

## 2021-11-01 DIAGNOSIS — M9905 Segmental and somatic dysfunction of pelvic region: Secondary | ICD-10-CM | POA: Diagnosis not present

## 2021-11-01 DIAGNOSIS — M546 Pain in thoracic spine: Secondary | ICD-10-CM | POA: Diagnosis not present

## 2021-11-01 DIAGNOSIS — M9902 Segmental and somatic dysfunction of thoracic region: Secondary | ICD-10-CM | POA: Diagnosis not present

## 2021-11-01 DIAGNOSIS — M62830 Muscle spasm of back: Secondary | ICD-10-CM | POA: Diagnosis not present

## 2021-11-01 DIAGNOSIS — M9903 Segmental and somatic dysfunction of lumbar region: Secondary | ICD-10-CM | POA: Diagnosis not present

## 2021-11-08 DIAGNOSIS — M9905 Segmental and somatic dysfunction of pelvic region: Secondary | ICD-10-CM | POA: Diagnosis not present

## 2021-11-08 DIAGNOSIS — M9901 Segmental and somatic dysfunction of cervical region: Secondary | ICD-10-CM | POA: Diagnosis not present

## 2021-11-08 DIAGNOSIS — M546 Pain in thoracic spine: Secondary | ICD-10-CM | POA: Diagnosis not present

## 2021-11-08 DIAGNOSIS — M542 Cervicalgia: Secondary | ICD-10-CM | POA: Diagnosis not present

## 2021-11-08 DIAGNOSIS — M9902 Segmental and somatic dysfunction of thoracic region: Secondary | ICD-10-CM | POA: Diagnosis not present

## 2021-11-08 DIAGNOSIS — M62830 Muscle spasm of back: Secondary | ICD-10-CM | POA: Diagnosis not present

## 2021-11-08 DIAGNOSIS — M9903 Segmental and somatic dysfunction of lumbar region: Secondary | ICD-10-CM | POA: Diagnosis not present

## 2021-11-15 DIAGNOSIS — M9905 Segmental and somatic dysfunction of pelvic region: Secondary | ICD-10-CM | POA: Diagnosis not present

## 2021-11-15 DIAGNOSIS — M9902 Segmental and somatic dysfunction of thoracic region: Secondary | ICD-10-CM | POA: Diagnosis not present

## 2021-11-15 DIAGNOSIS — M546 Pain in thoracic spine: Secondary | ICD-10-CM | POA: Diagnosis not present

## 2021-11-15 DIAGNOSIS — M542 Cervicalgia: Secondary | ICD-10-CM | POA: Diagnosis not present

## 2021-11-15 DIAGNOSIS — M9903 Segmental and somatic dysfunction of lumbar region: Secondary | ICD-10-CM | POA: Diagnosis not present

## 2021-11-15 DIAGNOSIS — M62830 Muscle spasm of back: Secondary | ICD-10-CM | POA: Diagnosis not present

## 2021-11-15 DIAGNOSIS — M9901 Segmental and somatic dysfunction of cervical region: Secondary | ICD-10-CM | POA: Diagnosis not present

## 2021-11-22 DIAGNOSIS — M9902 Segmental and somatic dysfunction of thoracic region: Secondary | ICD-10-CM | POA: Diagnosis not present

## 2021-11-22 DIAGNOSIS — M9905 Segmental and somatic dysfunction of pelvic region: Secondary | ICD-10-CM | POA: Diagnosis not present

## 2021-11-22 DIAGNOSIS — M546 Pain in thoracic spine: Secondary | ICD-10-CM | POA: Diagnosis not present

## 2021-11-22 DIAGNOSIS — M62830 Muscle spasm of back: Secondary | ICD-10-CM | POA: Diagnosis not present

## 2021-11-22 DIAGNOSIS — M542 Cervicalgia: Secondary | ICD-10-CM | POA: Diagnosis not present

## 2021-11-22 DIAGNOSIS — M9903 Segmental and somatic dysfunction of lumbar region: Secondary | ICD-10-CM | POA: Diagnosis not present

## 2021-11-22 DIAGNOSIS — M9901 Segmental and somatic dysfunction of cervical region: Secondary | ICD-10-CM | POA: Diagnosis not present

## 2021-12-24 ENCOUNTER — Ambulatory Visit (INDEPENDENT_AMBULATORY_CARE_PROVIDER_SITE_OTHER): Payer: No Typology Code available for payment source

## 2021-12-24 ENCOUNTER — Ambulatory Visit (INDEPENDENT_AMBULATORY_CARE_PROVIDER_SITE_OTHER): Payer: No Typology Code available for payment source | Admitting: Orthopedic Surgery

## 2021-12-24 DIAGNOSIS — M25511 Pain in right shoulder: Secondary | ICD-10-CM

## 2021-12-25 ENCOUNTER — Encounter: Payer: Self-pay | Admitting: Orthopedic Surgery

## 2021-12-25 NOTE — Progress Notes (Signed)
[facility] Return  Assessment: Carolyn Cunningham is a 71 y.o. female with the following: Right long head of the biceps tendon rupture  Plan: Carolyn Cunningham describes 2 separate incidents of popping sensation in the anterior right shoulder.  She had some pain, and this resolved.  Approximately a week ago, she noted some bruising in the mid biceps area.  She also has a deformity in this area.  Based on her description, I think she has sustained a ruptured long head of the biceps tendon.  She has a Popeye deformity on physical exam.  She does not have any pain at this point.  She has good range of motion and strength in the right shoulder.  I discussed the injury with her, and provided her with reassurance.  No activity restrictions.  Medications as needed.  Contact the clinic with issues.  Follow-up: Return if symptoms worsen or fail to improve.   Subjective:  Chief Complaint  Patient presents with   Shoulder Pain    RT shoulder/bicep/has "lump" on it. Came up after injury DOI 12/14/21    History of Present Illness: Carolyn Cunningham is a 71 y.o. female who returns to clinic for evaluation of right shoulder pain.  I have previously seen her in clinic for an ankle injury.  Within the last couple weeks, she notes 2 separate incidents where she had a popping sensation in the anterior right shoulder.  She had some pain following the first injury.  After the second injury, she did not have as much pain.  A few days later, she noticed some bruising in the biceps area.  She is also noticed a lump in the middle of the biceps.  She is not having much pain at this point.  She is able to continue with her usual activities.  However, the deformity in the biceps was concerning to her.  Review of Systems: No fevers or chills No numbness or tingling No chest pain No shortness of breath No bowel or bladder dysfunction No GI distress No headaches   Objective: There were no vitals taken for this  visit.  Physical Exam:  Evaluation of the right shoulder demonstrates no deformity.  She has a Popeye deformity of the right biceps.  Minimal tenderness to palpation over the anterior shoulder.  She has full and relatively painless range of motion of the right shoulder.  Strength testing is 5/5.  No pain with O'Brien's testing.  Negative Yergason's.  IMAGING: I personally ordered and reviewed the following images:  X-rays of the right shoulder were obtained in clinic today.  No acute injuries are noted.  No proximal humeral migration.  Minimal degenerative changes overall.  Glenohumeral joint is reduced.  Impression: Negative right shoulder x-ray  Mordecai Rasmussen, MD 12/25/2021 8:29 AM

## 2022-01-13 ENCOUNTER — Ambulatory Visit: Payer: No Typology Code available for payment source | Admitting: Podiatry

## 2022-02-27 DIAGNOSIS — H5200 Hypermetropia, unspecified eye: Secondary | ICD-10-CM | POA: Diagnosis not present

## 2022-03-04 DIAGNOSIS — Z0100 Encounter for examination of eyes and vision without abnormal findings: Secondary | ICD-10-CM | POA: Diagnosis not present

## 2022-03-12 ENCOUNTER — Other Ambulatory Visit: Payer: Self-pay | Admitting: Obstetrics and Gynecology

## 2022-03-12 DIAGNOSIS — Z1211 Encounter for screening for malignant neoplasm of colon: Secondary | ICD-10-CM | POA: Diagnosis not present

## 2022-03-12 DIAGNOSIS — Z124 Encounter for screening for malignant neoplasm of cervix: Secondary | ICD-10-CM | POA: Diagnosis not present

## 2022-03-12 DIAGNOSIS — Z1231 Encounter for screening mammogram for malignant neoplasm of breast: Secondary | ICD-10-CM

## 2022-03-17 DIAGNOSIS — Z1211 Encounter for screening for malignant neoplasm of colon: Secondary | ICD-10-CM | POA: Diagnosis not present

## 2022-04-04 DIAGNOSIS — E663 Overweight: Secondary | ICD-10-CM | POA: Diagnosis not present

## 2022-04-04 DIAGNOSIS — Z6826 Body mass index (BMI) 26.0-26.9, adult: Secondary | ICD-10-CM | POA: Diagnosis not present

## 2022-04-04 DIAGNOSIS — Z008 Encounter for other general examination: Secondary | ICD-10-CM | POA: Diagnosis not present

## 2022-04-04 DIAGNOSIS — K219 Gastro-esophageal reflux disease without esophagitis: Secondary | ICD-10-CM | POA: Diagnosis not present

## 2022-04-05 IMAGING — CT CT ABD-PELV W/ CM
2 of 5 series · 15 of 46 positions shown, 17 images · IV contrast (Omnipaque or Isovue)
Comparison: 03/13/2019, 07/10/2005

CLINICAL DATA: Left lower quadrant abdominal pain, hematochezia

EXAM:
CT ABDOMEN AND PELVIS WITH CONTRAST
TECHNIQUE: Multidetector CT imaging of the abdomen and pelvis was performed
using the standard protocol following bolus administration of
intravenous contrast.
CONTRAST:  100mL OMNIPAQUE IOHEXOL 300 MG/ML  SOLN

[Series 2: axial st · axial · 0.78mm/px · z∈[+714,+1159]mm · 12 of 101 slices shown, 14 images]
[im 6/101  soft-tissue]
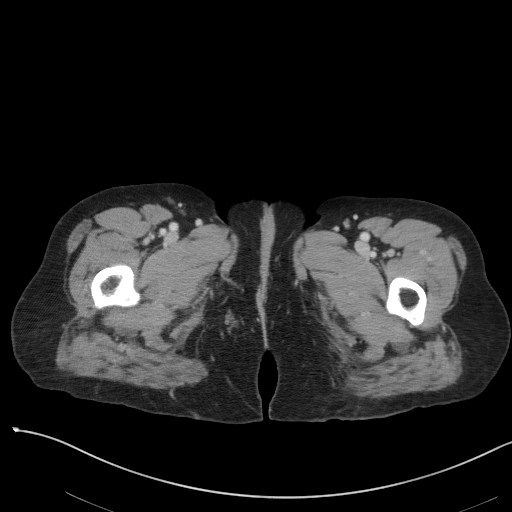
[im 6/101  bone]
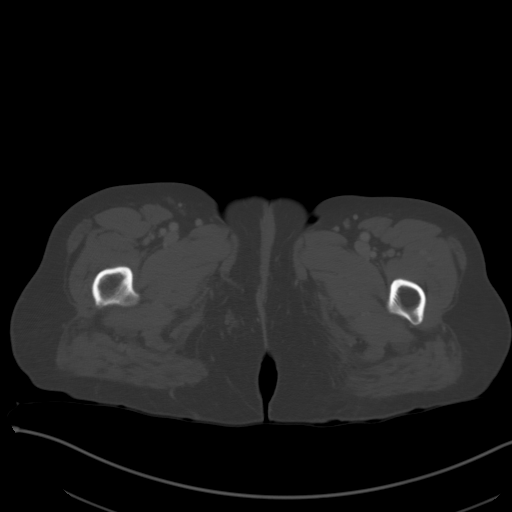
[im 18/101  soft-tissue]
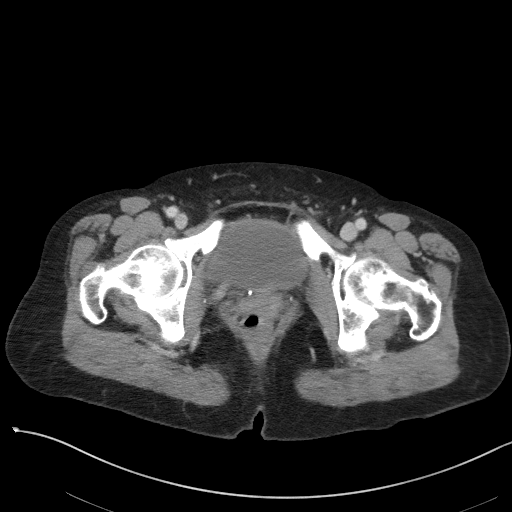
[im 24/101  soft-tissue]
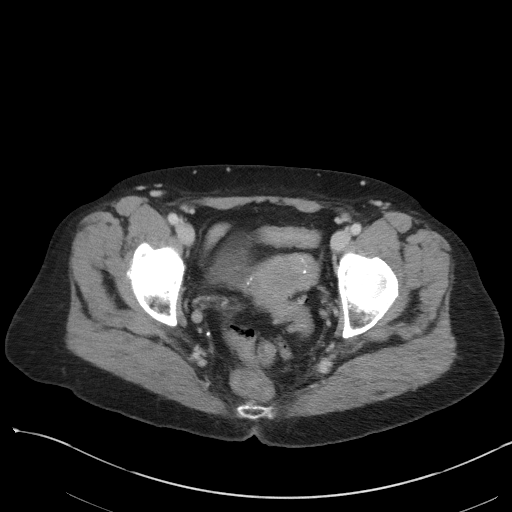
[im 30/101  soft-tissue]
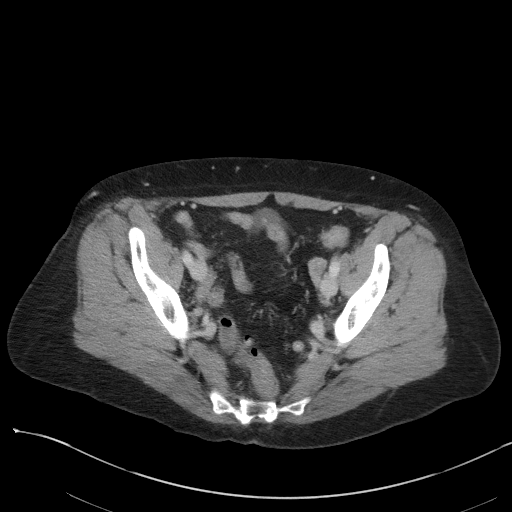
[im 42/101  soft-tissue]
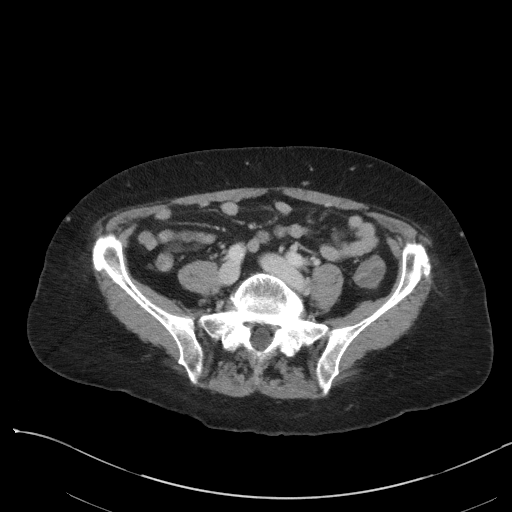
[im 48/101  soft-tissue]
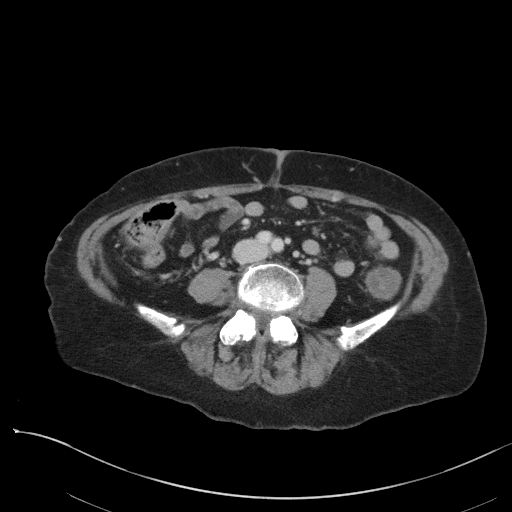
[im 53/101  soft-tissue]
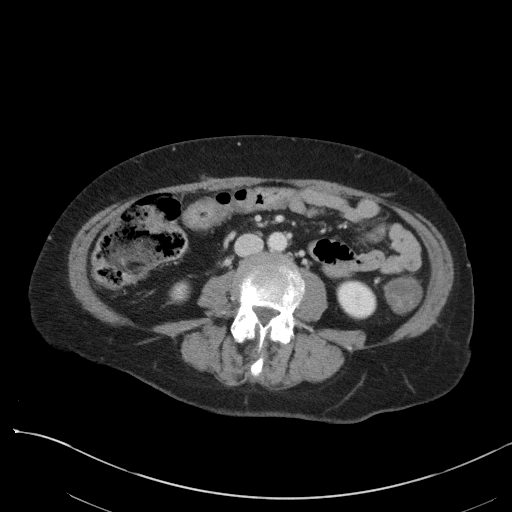
[im 65/101  soft-tissue]
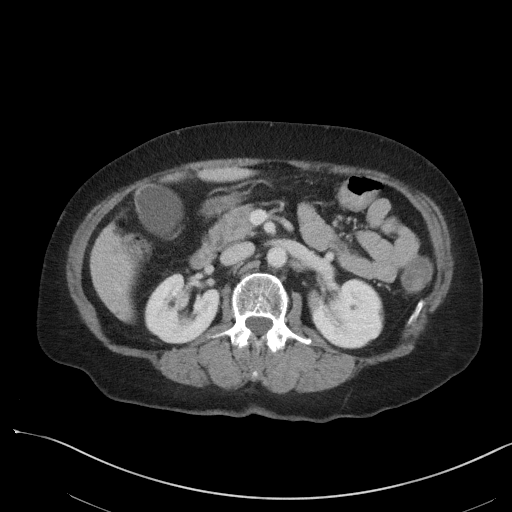
[im 71/101  soft-tissue]
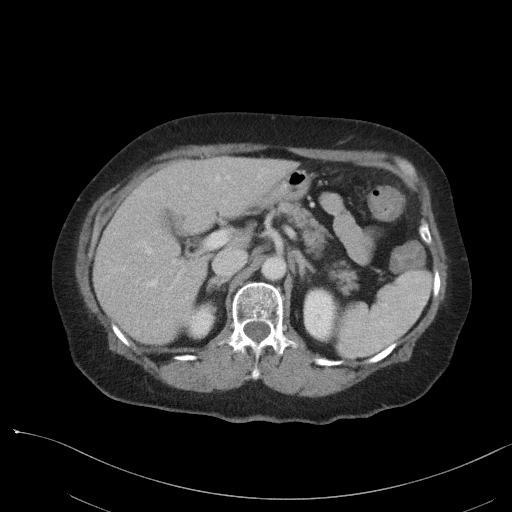
[im 71/101  bone]
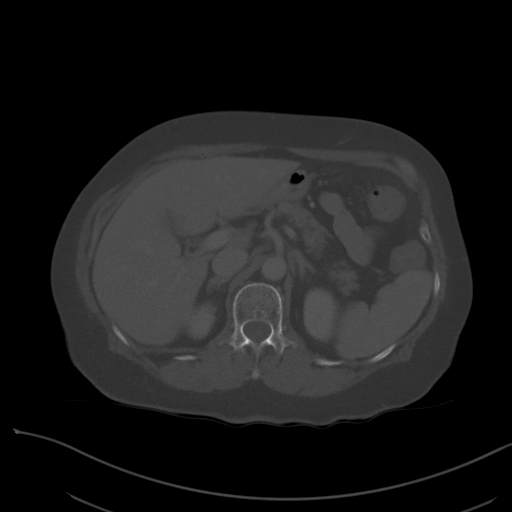
[im 77/101  soft-tissue]
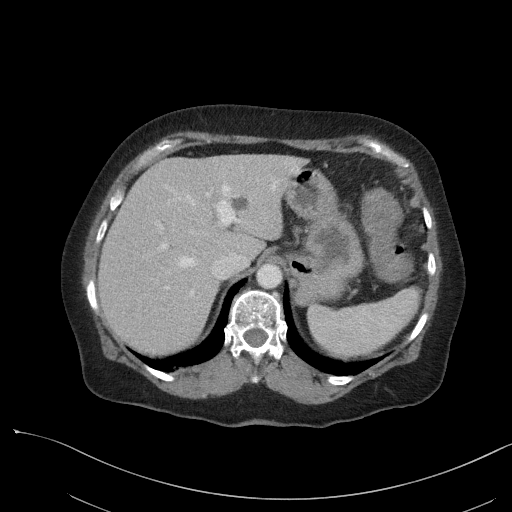
[im 89/101  soft-tissue]
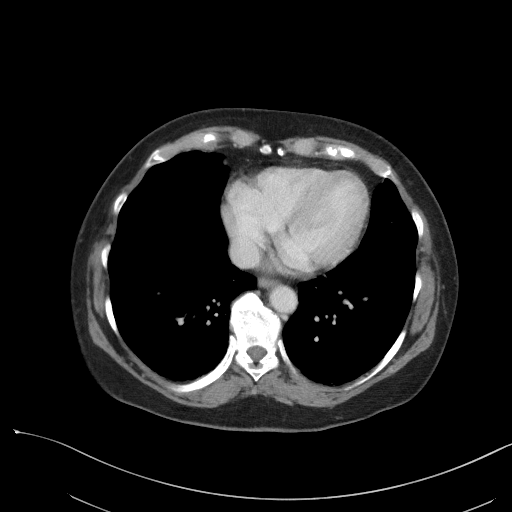
[im 95/101  soft-tissue]
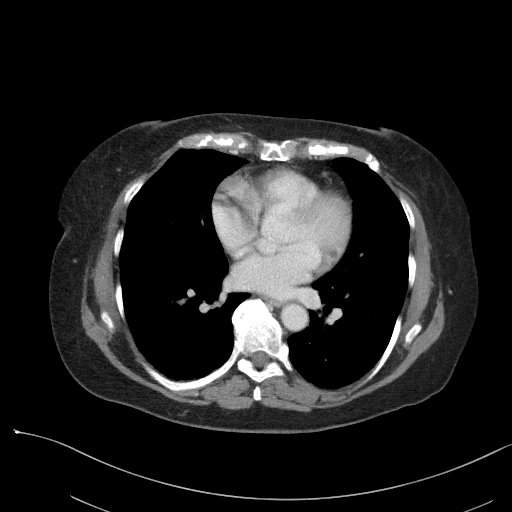

[Series 6: coronal st · coronal · 0.70mm/px · 3 of 96 slices shown]
[im 32/96  soft-tissue]
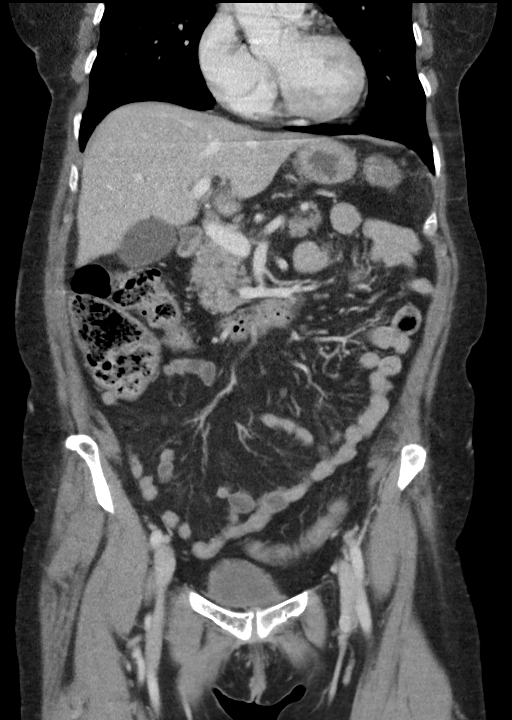
[im 43/96  soft-tissue]
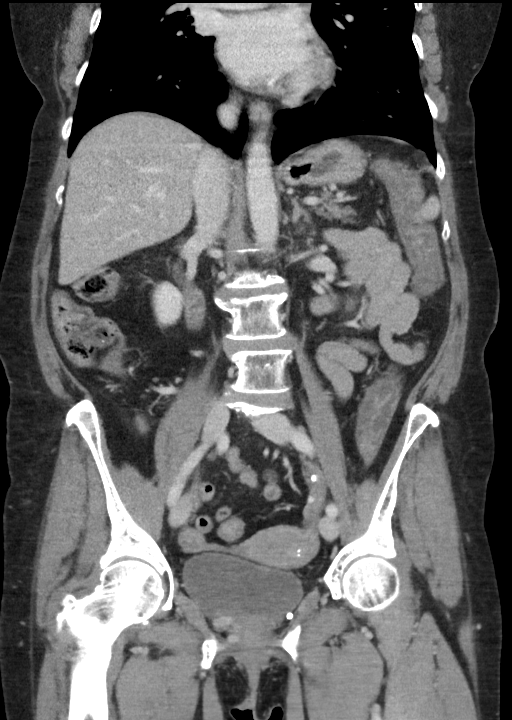
[im 53/96  soft-tissue]
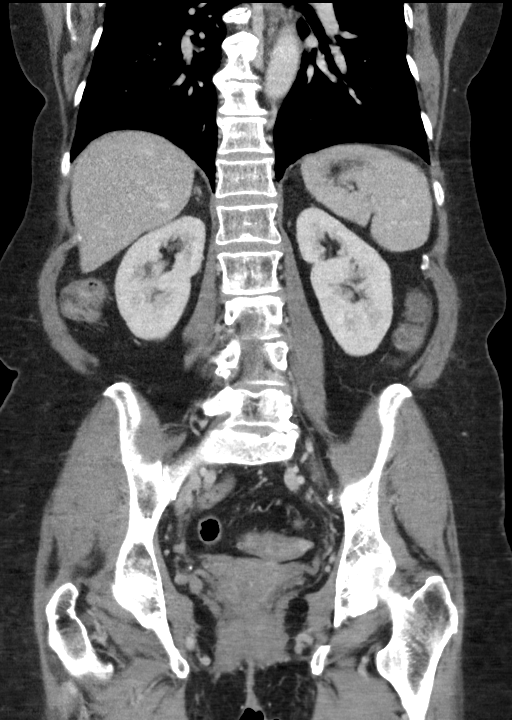

[15 of 46 positions shown; findings below may reference images not displayed]

FINDINGS: Lower chest: No acute abnormality.

Hepatobiliary: There are 3 stable cysts within the liver, largest
measuring 1 2 cm in the left lobe. No new hepatic lesion.
Peripherally calcified 2.0 stone is again noted within the
gallbladder lumen. There is enhancement of the wall of the
gallbladder fundus. No pericholecystic inflammatory changes. No
biliary dilatation.

Pancreas: Unremarkable. No pancreatic ductal dilatation or
surrounding inflammatory changes.

Spleen: Normal in size without focal abnormality.

Adrenals/Urinary Tract: Unremarkable adrenal glands. Kidneys enhance
symmetrically without focal lesion, stone, or hydronephrosis.
Ureters are nondilated. Urinary bladder appears unremarkable.

Stomach/Bowel: Stomach is unremarkable. Small bowel appears within
normal limits. No dilated loops of small bowel. There is prominent
long segment circumferential thickening and mucosal edema of the
descending colon. Similar, more mild findings are seen within the
sigmoid and transverse colon. There are scattered sigmoid
diverticula. No focal pericolonic inflammatory changes or fluid.

Vascular/Lymphatic: No significant vascular findings are present. No
enlarged abdominal or pelvic lymph nodes.

Reproductive: Uterus and bilateral adnexa are unremarkable.

Other: No free fluid. No abdominopelvic fluid collection. No
pneumoperitoneum. No abdominal wall hernia.

Musculoskeletal: Mild superior endplate depression of the L5
vertebral body is new from 03/13/2019 (series 7, image 57).
Otherwise, no new or acute osseous findings. Degenerative changes
within the spine and bilateral hips.
IMPRESSION: 1. Prominent long segment circumferential thickening and mucosal
edema of the descending colon. Similar, more mild findings are seen
within the sigmoid and transverse colon. Findings are suggestive of
an infectious or inflammatory colitis.
2. Sigmoid diverticulosis without focal inflammatory changes to
suggest acute diverticulitis.
3. Mild superior endplate depression of the L5 vertebral body is new
from 03/13/2019. Correlate for point tenderness.
4. Cholelithiasis. There is enhancement involving the wall of the
gallbladder fundus. No pericholecystic inflammatory changes are
evident by CT. Recommend further evaluation with right upper
quadrant ultrasound.

## 2022-04-15 DIAGNOSIS — E538 Deficiency of other specified B group vitamins: Secondary | ICD-10-CM | POA: Diagnosis not present

## 2022-04-15 DIAGNOSIS — Z0000 Encounter for general adult medical examination without abnormal findings: Secondary | ICD-10-CM | POA: Diagnosis not present

## 2022-04-15 DIAGNOSIS — Z79899 Other long term (current) drug therapy: Secondary | ICD-10-CM | POA: Diagnosis not present

## 2022-04-22 DIAGNOSIS — E042 Nontoxic multinodular goiter: Secondary | ICD-10-CM | POA: Diagnosis not present

## 2022-04-22 DIAGNOSIS — I471 Supraventricular tachycardia, unspecified: Secondary | ICD-10-CM | POA: Diagnosis not present

## 2022-04-22 DIAGNOSIS — Z79899 Other long term (current) drug therapy: Secondary | ICD-10-CM | POA: Diagnosis not present

## 2022-04-22 DIAGNOSIS — Z0000 Encounter for general adult medical examination without abnormal findings: Secondary | ICD-10-CM | POA: Diagnosis not present

## 2022-04-22 DIAGNOSIS — Z23 Encounter for immunization: Secondary | ICD-10-CM | POA: Diagnosis not present

## 2022-04-22 DIAGNOSIS — R739 Hyperglycemia, unspecified: Secondary | ICD-10-CM | POA: Diagnosis not present

## 2022-05-12 ENCOUNTER — Ambulatory Visit
Admission: RE | Admit: 2022-05-12 | Discharge: 2022-05-12 | Disposition: A | Discharge status: Home / Self Care | Payer: No Typology Code available for payment source | Source: Ambulatory Visit | Attending: Obstetrics and Gynecology | Admitting: Obstetrics and Gynecology

## 2022-05-12 DIAGNOSIS — Z1231 Encounter for screening mammogram for malignant neoplasm of breast: Secondary | ICD-10-CM | POA: Diagnosis not present

## 2022-07-30 ENCOUNTER — Ambulatory Visit (INDEPENDENT_AMBULATORY_CARE_PROVIDER_SITE_OTHER): Payer: No Typology Code available for payment source | Admitting: Dermatology

## 2022-07-30 DIAGNOSIS — D229 Melanocytic nevi, unspecified: Secondary | ICD-10-CM | POA: Diagnosis not present

## 2022-07-30 DIAGNOSIS — L821 Other seborrheic keratosis: Secondary | ICD-10-CM | POA: Diagnosis not present

## 2022-07-30 DIAGNOSIS — Z1283 Encounter for screening for malignant neoplasm of skin: Secondary | ICD-10-CM | POA: Diagnosis not present

## 2022-07-30 DIAGNOSIS — L578 Other skin changes due to chronic exposure to nonionizing radiation: Secondary | ICD-10-CM | POA: Diagnosis not present

## 2022-07-30 DIAGNOSIS — L814 Other melanin hyperpigmentation: Secondary | ICD-10-CM

## 2022-07-30 DIAGNOSIS — D1801 Hemangioma of skin and subcutaneous tissue: Secondary | ICD-10-CM

## 2022-07-30 DIAGNOSIS — L738 Other specified follicular disorders: Secondary | ICD-10-CM | POA: Diagnosis not present

## 2022-07-30 NOTE — Patient Instructions (Signed)
Due to recent changes in healthcare laws, you may see results of your pathology and/or laboratory studies on MyChart before the doctors have had a chance to review them. We understand that in some cases there may be results that are confusing or concerning to you. Please understand that not all results are received at the same time and often the doctors may need to interpret multiple results in order to provide you with the best plan of care or course of treatment. Therefore, we ask that you please give us 2 business days to thoroughly review all your results before contacting the office for clarification. Should we see a critical lab result, you will be contacted sooner.   If You Need Anything After Your Visit  If you have any questions or concerns for your doctor, please call our main line at [phone] and press option 4 to reach your doctor's medical assistant. If no one answers, please leave a voicemail as directed and we will return your call as soon as possible. Messages left after 4 pm will be answered the following business day.   You may also send us a message via MyChart. We typically respond to MyChart messages within 1-2 business days.  For prescription refills, please ask your pharmacy to contact our office. Our fax number is [phone].  If you have an urgent issue when the clinic is closed that cannot wait until the next business day, you can [place] your doctor at the number below.    Please note that while we do our best to be available for urgent issues outside of office hours, we are not available 24/7.   If you have an urgent issue and are unable to reach us, you may choose to seek medical care at your doctor's office, retail clinic, urgent care center, or emergency room.  If you have a medical emergency, please immediately call 911 or go to the emergency department.  Pager Numbers  - Dr. Kowalski: [phone]  - Dr. Moye: [phone]  - Dr. Stewart:  [phone]  In the event of inclement weather, please call our main line at [phone] for an update on the status of any delays or closures.  Dermatology Medication Tips: Please keep the boxes that topical medications come in in order to help keep track of the instructions about where and how to use these. Pharmacies typically print the medication instructions only on the boxes and not directly on the medication tubes.   If your medication is too expensive, please contact our office at [phone] option 4 or send us a message through MyChart.   We are unable to tell what your co-pay for medications will be in advance as this is different depending on your insurance coverage. However, we may be able to find a substitute medication at lower cost or fill out paperwork to get insurance to cover a needed medication.   If a prior authorization is required to get your medication covered by your insurance company, please allow us 1-2 business days to complete this process.  Drug prices often vary depending on where the prescription is filled and some pharmacies may offer cheaper prices.  The website www.goodrx.com contains coupons for medications through different pharmacies. The prices here do not account for what the cost may be with help from insurance (it may be cheaper with your insurance), but the website can give you the price if you did not use any insurance.  - You can print the associated coupon and take it with   your prescription to the pharmacy.  - You may also stop by our office during regular business hours and pick up a GoodRx coupon card.  - If you need your prescription sent electronically to a different pharmacy, notify our office through [facility] MyChart or by phone at [phone] option 4.     Si Usted Necesita Algo Despus de Su Visita  Tambin puede enviarnos un mensaje a travs de MyChart. Por lo general respondemos a los mensajes de MyChart en el transcurso de 1 a 2  das hbiles.  Para renovar recetas, por favor pida a su farmacia que se ponga en contacto con nuestra oficina. Nuestro nmero de fax es el [phone].  Si tiene un asunto urgente cuando la clnica est cerrada y que no puede esperar hasta el siguiente da hbil, puede llamar/localizar a su doctor(a) al nmero que aparece a continuacin.   Por favor, tenga en cuenta que aunque hacemos todo lo posible para estar disponibles para asuntos urgentes fuera del horario de oficina, no estamos disponibles las 24 horas del da, los 7 das de la semana.   Si tiene un problema urgente y no puede comunicarse con nosotros, puede optar por buscar atencin mdica  en el consultorio de su doctor(a), en una clnica privada, en un centro de atencin urgente o en una sala de emergencias.  Si tiene una emergencia mdica, por favor llame inmediatamente al 911 o vaya a la sala de emergencias.  Nmeros de bper  - Dr. Kowalski: [phone]  - Dra. Moye: [phone]  - Dra. Stewart: [phone]  En caso de inclemencias del tiempo, por favor llame a nuestra lnea principal al [phone] para una actualizacin sobre el estado de cualquier retraso o cierre.  Consejos para la medicacin en dermatologa: Por favor, guarde las cajas en las que vienen los medicamentos de uso tpico para ayudarle a seguir las instrucciones sobre dnde y cmo usarlos. Las farmacias generalmente imprimen las instrucciones del medicamento slo en las cajas y no directamente en los tubos del medicamento.   Si su medicamento es muy caro, por favor, pngase en contacto con nuestra oficina llamando al [phone] y presione la opcin 4 o envenos un mensaje a travs de MyChart.   No podemos decirle cul ser su copago por los medicamentos por adelantado ya que esto es diferente dependiendo de la cobertura de su seguro. Sin embargo, es posible que podamos encontrar un medicamento sustituto a menor costo o llenar un formulario para que el  seguro cubra el medicamento que se considera necesario.   Si se requiere una autorizacin previa para que su compaa de seguros cubra su medicamento, por favor permtanos de 1 a 2 das hbiles para completar este proceso.  Los precios de los medicamentos varan con frecuencia dependiendo del lugar de dnde se surte la receta y alguna farmacias pueden ofrecer precios ms baratos.  El sitio web www.goodrx.com tiene cupones para medicamentos de diferentes farmacias. Los precios aqu no tienen en cuenta lo que podra costar con la ayuda del seguro (puede ser ms barato con su seguro), pero el sitio web puede darle el precio si no utiliz ningn seguro.  - Puede imprimir el cupn correspondiente y llevarlo con su receta a la farmacia.  - Tambin puede pasar por nuestra oficina durante el horario de atencin regular y recoger una tarjeta de cupones de GoodRx.  - Si necesita que su receta se enve electrnicamente a una farmacia diferente, informe a nuestra oficina a travs de MyChart de [facility]   o por telfono llamando al [phone] y presione la opcin 4.  

## 2022-07-30 NOTE — Progress Notes (Unsigned)
   New Patient Visit  Subjective  Carolyn Cunningham is a 71 y.o. female who presents for the following: Other (History of AK - The patient presents for Total-Body Skin Exam (TBSE) for skin cancer screening and mole check.  The patient has spots, moles and lesions to be evaluated, some may be new or changing and the patient has concerns that these could be cancer./).  The following portions of the chart were reviewed this encounter and updated as appropriate:   Tobacco  Allergies  Meds  Problems  Med Hx  Surg Hx  Fam Hx     Review of Systems:  No other skin or systemic complaints except as noted in HPI or Assessment and Plan.  Objective  Well appearing patient in no apparent distress; mood and affect are within normal limits.  A full examination was performed including scalp, head, eyes, ears, nose, lips, neck, chest, axillae, abdomen, back, buttocks, bilateral upper extremities, bilateral lower extremities, hands, feet, fingers, toes, fingernails, and toenails. All findings within normal limits unless otherwise noted below.  Face Yellow papules   Assessment & Plan   Lentigines - Scattered tan macules - Due to sun exposure - Benign-appearing, observe - Recommend daily broad spectrum sunscreen SPF 30+ to sun-exposed areas, reapply every 2 hours as needed. - Call for any changes  Seborrheic Keratoses - Stuck-on, waxy, tan-brown papules and/or plaques  - Benign-appearing - Discussed benign etiology and prognosis. - Observe - Call for any changes  Melanocytic Nevi - Tan-brown and/or pink-flesh-colored symmetric macules and papules - Benign appearing on exam today - Observation - Call clinic for new or changing moles - Recommend daily use of broad spectrum spf 30+ sunscreen to sun-exposed areas.   Hemangiomas - Red papules - Discussed benign nature - Observe - Call for any changes  Actinic Damage - Chronic condition, secondary to cumulative UV/sun exposure - diffuse  scaly erythematous macules with underlying dyspigmentation - Recommend daily broad spectrum sunscreen SPF 30+ to sun-exposed areas, reapply every 2 hours as needed.  - Staying in the shade or wearing long sleeves, sun glasses (UVA+UVB protection) and wide brim hats (4-inch brim around the entire circumference of the hat) are also recommended for sun protection.  - Call for new or changing lesions. - Counseling for BBL / IPL / Laser and Coordination of Care Discussed the treatment option of Broad Band Light (BBL) Intense Pulsed Light (IPL) / Laser.  Typically we recommend at least 1-3 treatment sessions about 5-8 weeks apart for best results.  The patient's condition may require "maintenance treatments" in the future.  The fee for BBL / laser treatments is $350 per treatment session for the chest.  A fee can be quoted for other parts of the body. Insurance typically does not pay for BBL/laser treatments and therefore the fee is an out-of-pocket cost.  Skin cancer screening performed today.  Sebaceous hyperplasia Face Benign-appearing.  Observation.  Call clinic for new or changing lesions.  Recommend daily use of broad spectrum spf 30+ sunscreen to sun-exposed areas.   Return in about 1 year (around 07/31/2023) for TBSE.  I, Ashok Cordia, CMA, am acting as scribe for Sarina Ser, MD . Documentation: I have reviewed the above documentation for accuracy and completeness, and I agree with the above.  Sarina Ser, MD

## 2022-07-31 ENCOUNTER — Encounter: Payer: Self-pay | Admitting: Dermatology

## 2022-09-18 ENCOUNTER — Encounter: Payer: Self-pay | Admitting: Radiology

## 2022-09-25 IMAGING — DX DG ANKLE COMPLETE 3+V*R*
3 series · 3 of 3 positions shown · non-contrast
Comparison: 08/13/2020

CLINICAL DATA: History of prior fall with ankle pain, subsequent
encounter

EXAM:
RIGHT ANKLE - COMPLETE 3+ VIEW

[ankle ap]
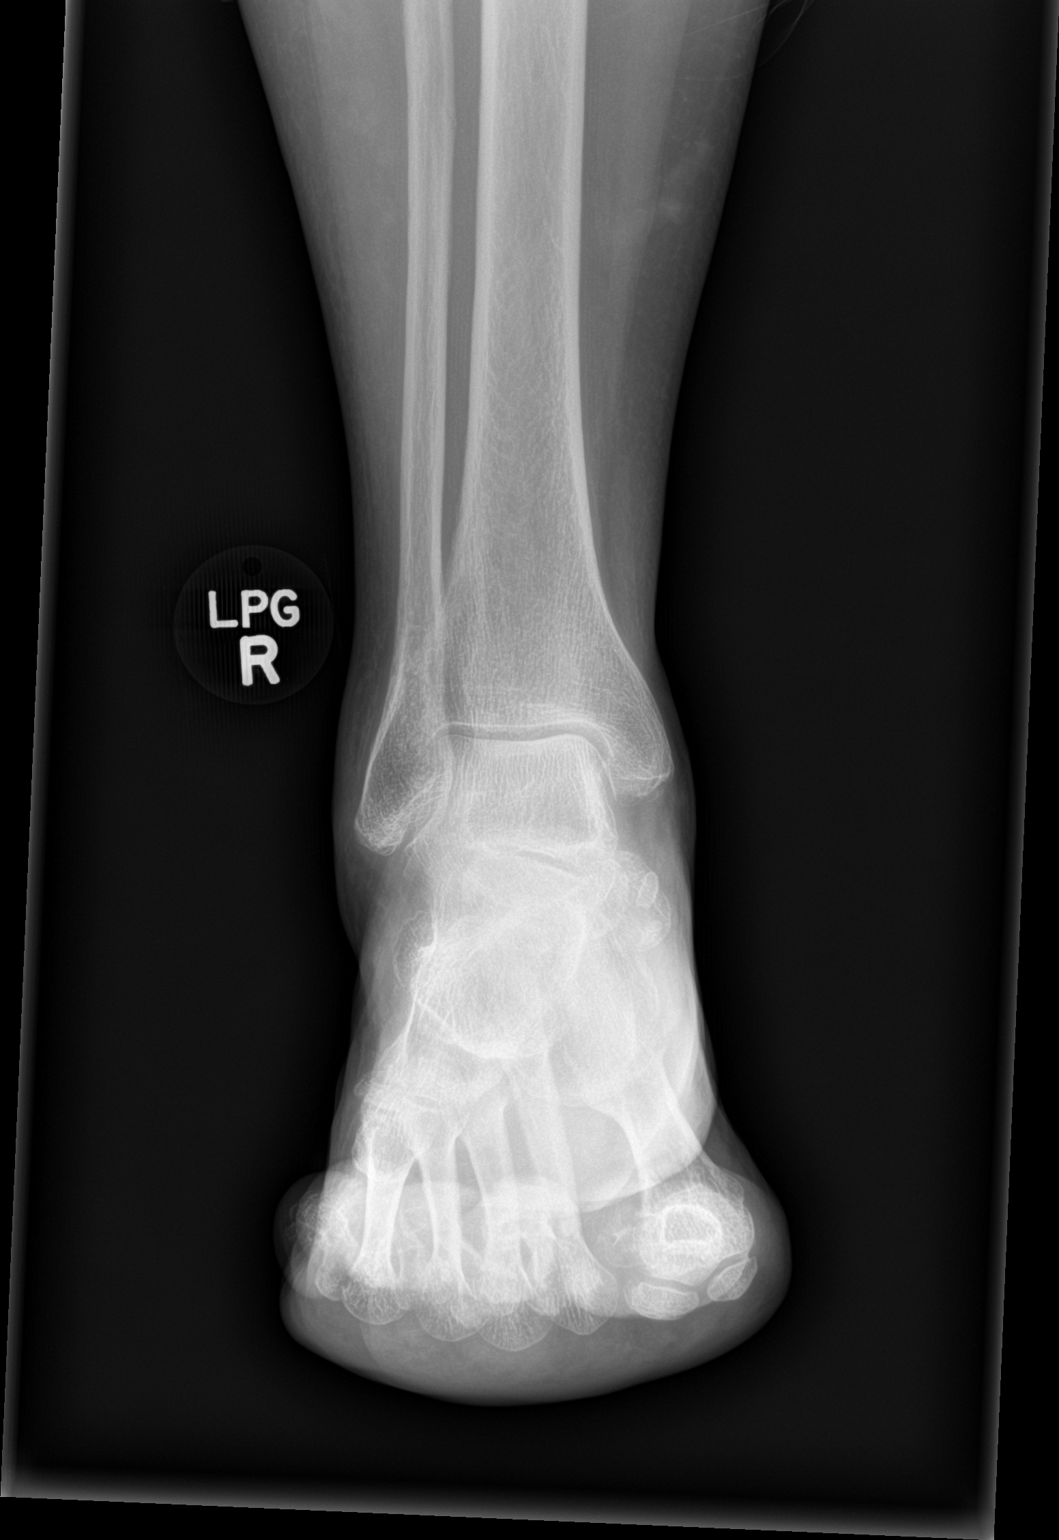

[ankle mortise]
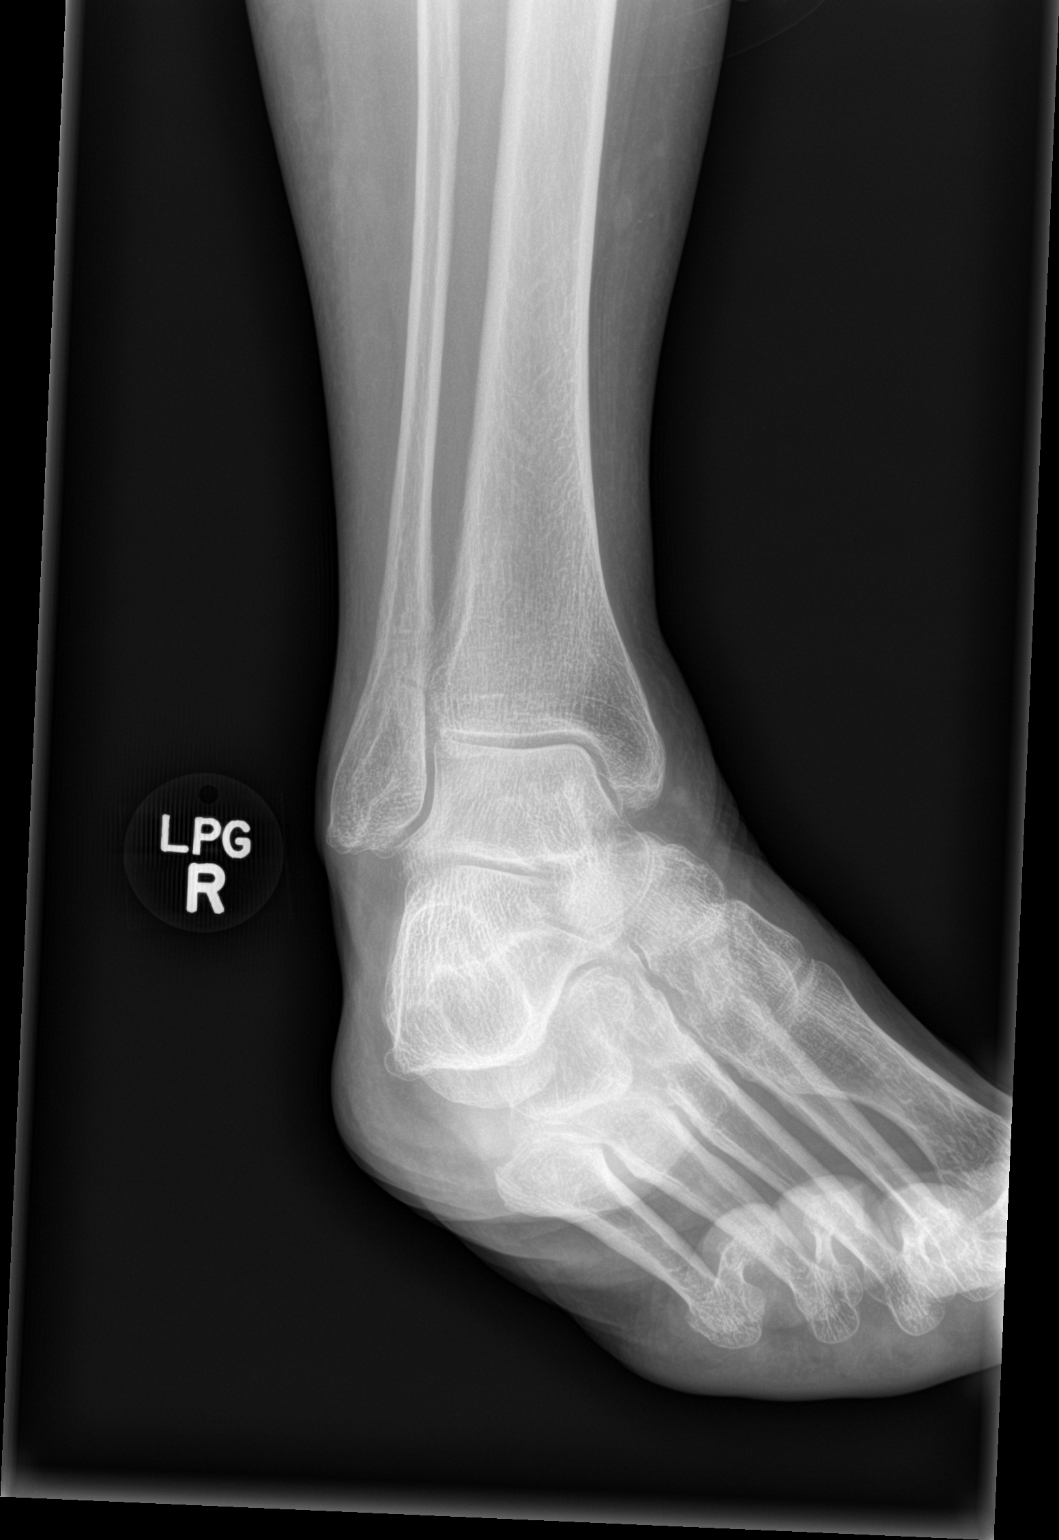

[ankle lat]
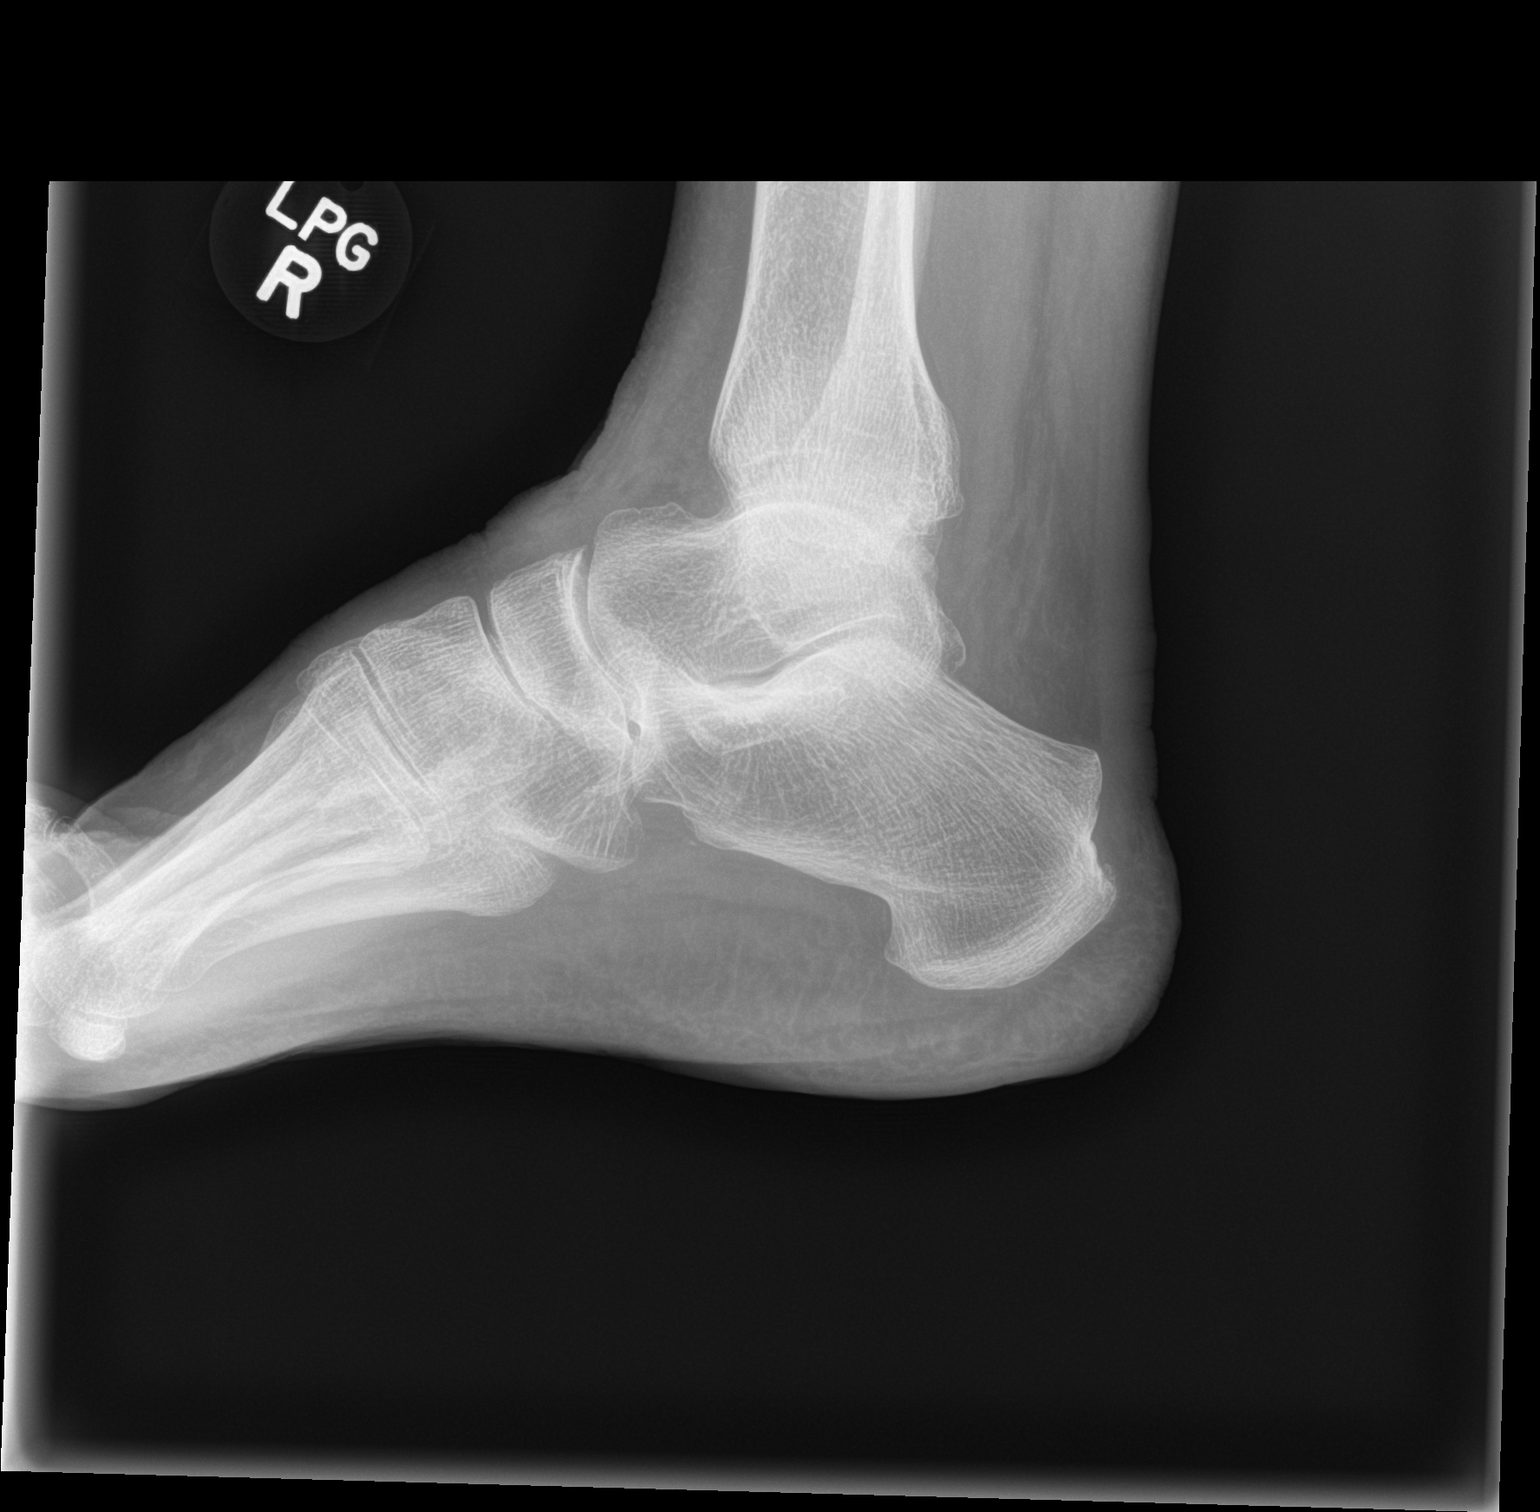

[3 of 3 positions shown; findings below may reference images not displayed]

FINDINGS: No acute fracture or dislocation is identified. Previously seen soft
tissue swelling has resolved.
IMPRESSION: No acute abnormality noted.

## 2022-10-03 DIAGNOSIS — R0789 Other chest pain: Secondary | ICD-10-CM | POA: Diagnosis not present

## 2022-10-03 DIAGNOSIS — K2100 Gastro-esophageal reflux disease with esophagitis, without bleeding: Secondary | ICD-10-CM | POA: Diagnosis not present

## 2022-10-03 DIAGNOSIS — R000 Tachycardia, unspecified: Secondary | ICD-10-CM | POA: Diagnosis not present

## 2022-10-16 DIAGNOSIS — I4719 Other supraventricular tachycardia: Secondary | ICD-10-CM | POA: Insufficient documentation

## 2022-10-16 DIAGNOSIS — R0789 Other chest pain: Secondary | ICD-10-CM | POA: Diagnosis not present

## 2022-11-14 DIAGNOSIS — K219 Gastro-esophageal reflux disease without esophagitis: Secondary | ICD-10-CM | POA: Diagnosis not present

## 2022-11-14 DIAGNOSIS — R079 Chest pain, unspecified: Secondary | ICD-10-CM | POA: Diagnosis not present

## 2022-11-14 DIAGNOSIS — R000 Tachycardia, unspecified: Secondary | ICD-10-CM | POA: Diagnosis not present

## 2022-11-14 DIAGNOSIS — R002 Palpitations: Secondary | ICD-10-CM | POA: Diagnosis not present

## 2022-11-14 DIAGNOSIS — I4719 Other supraventricular tachycardia: Secondary | ICD-10-CM | POA: Diagnosis not present

## 2022-11-14 DIAGNOSIS — R9439 Abnormal result of other cardiovascular function study: Secondary | ICD-10-CM | POA: Diagnosis not present

## 2022-11-17 ENCOUNTER — Other Ambulatory Visit: Payer: Self-pay | Admitting: Internal Medicine

## 2022-11-17 DIAGNOSIS — R9439 Abnormal result of other cardiovascular function study: Secondary | ICD-10-CM

## 2022-12-08 ENCOUNTER — Encounter (HOSPITAL_COMMUNITY): Payer: Self-pay

## 2022-12-08 ENCOUNTER — Other Ambulatory Visit (HOSPITAL_COMMUNITY): Payer: Self-pay | Admitting: *Deleted

## 2022-12-08 MED ORDER — METOPROLOL TARTRATE 25 MG PO TABS: ORAL_TABLET | ORAL | 0 refills | Status: AC

## 2022-12-09 ENCOUNTER — Telehealth (HOSPITAL_COMMUNITY): Payer: Self-pay | Admitting: *Deleted

## 2022-12-09 NOTE — Telephone Encounter (Signed)
Reaching out to patient to offer assistance regarding upcoming cardiac imaging study; pt verbalizes understanding of appt date/time, parking situation and where to check in, pre-test NPO status and medications ordered, and verified current allergies; name and call back number provided for further questions should they arise  Kevion Fatheree RN Navigator Cardiac Imaging [facility] [phone] office [phone] cell  Patient to take 25mg metoprolol tartrate two hours prior to her cardiac CT scan.   

## 2022-12-11 ENCOUNTER — Ambulatory Visit
Admission: RE | Admit: 2022-12-11 | Discharge: 2022-12-11 | Disposition: A | Discharge status: Home / Self Care | Payer: No Typology Code available for payment source | Source: Ambulatory Visit | Attending: Internal Medicine | Admitting: Internal Medicine

## 2022-12-11 DIAGNOSIS — R0789 Other chest pain: Secondary | ICD-10-CM | POA: Diagnosis not present

## 2022-12-11 DIAGNOSIS — I209 Angina pectoris, unspecified: Secondary | ICD-10-CM | POA: Diagnosis not present

## 2022-12-11 DIAGNOSIS — R9430 Abnormal result of cardiovascular function study, unspecified: Secondary | ICD-10-CM

## 2022-12-11 DIAGNOSIS — R9439 Abnormal result of other cardiovascular function study: Secondary | ICD-10-CM | POA: Diagnosis not present

## 2022-12-11 DIAGNOSIS — R002 Palpitations: Secondary | ICD-10-CM | POA: Diagnosis not present

## 2022-12-11 MED ORDER — IOHEXOL 350 MG/ML SOLN
75.0000 mL | Freq: Once | INTRAVENOUS | Status: AC | PRN
  Administered 2022-12-11: 75 mL via INTRAVENOUS

## 2022-12-11 MED ORDER — NITROGLYCERIN 0.4 MG SL SUBL
0.8000 mg | SUBLINGUAL_TABLET | Freq: Once | SUBLINGUAL | Status: AC
  Administered 2022-12-11: 0.8 mg via SUBLINGUAL

## 2022-12-11 NOTE — Progress Notes (Signed)
Patient tolerated procedure well. Ambulate w/o difficulty. Denies light headedness or being dizzy. Sitting in chair drinking water provided. Encouraged to drink extra water today and reasoning explained. Verbalized understanding. All questions answered. ABC intact. No further needs. Discharge from procedure area w/o issues.   °

## 2022-12-18 ENCOUNTER — Ambulatory Visit: Admission: RE | Admit: 2022-12-18 | Payer: No Typology Code available for payment source | Source: Ambulatory Visit

## 2023-02-02 DIAGNOSIS — M9905 Segmental and somatic dysfunction of pelvic region: Secondary | ICD-10-CM | POA: Diagnosis not present

## 2023-02-02 DIAGNOSIS — M62830 Muscle spasm of back: Secondary | ICD-10-CM | POA: Diagnosis not present

## 2023-02-02 DIAGNOSIS — M9903 Segmental and somatic dysfunction of lumbar region: Secondary | ICD-10-CM | POA: Diagnosis not present

## 2023-02-02 DIAGNOSIS — M9902 Segmental and somatic dysfunction of thoracic region: Secondary | ICD-10-CM | POA: Diagnosis not present

## 2023-03-18 DIAGNOSIS — Z008 Encounter for other general examination: Secondary | ICD-10-CM | POA: Diagnosis not present

## 2023-03-18 DIAGNOSIS — Z86010 Personal history of colonic polyps: Secondary | ICD-10-CM | POA: Diagnosis not present

## 2023-03-18 DIAGNOSIS — Z6825 Body mass index (BMI) 25.0-25.9, adult: Secondary | ICD-10-CM | POA: Diagnosis not present

## 2023-03-18 DIAGNOSIS — E663 Overweight: Secondary | ICD-10-CM | POA: Diagnosis not present

## 2023-03-18 DIAGNOSIS — R002 Palpitations: Secondary | ICD-10-CM | POA: Diagnosis not present

## 2023-03-26 ENCOUNTER — Other Ambulatory Visit: Payer: Self-pay | Admitting: Obstetrics and Gynecology

## 2023-03-26 DIAGNOSIS — Z1231 Encounter for screening mammogram for malignant neoplasm of breast: Secondary | ICD-10-CM

## 2023-03-26 DIAGNOSIS — Z01411 Encounter for gynecological examination (general) (routine) with abnormal findings: Secondary | ICD-10-CM | POA: Diagnosis not present

## 2023-03-26 DIAGNOSIS — Z800 Family history of malignant neoplasm of digestive organs: Secondary | ICD-10-CM | POA: Diagnosis not present

## 2023-03-26 DIAGNOSIS — M8589 Other specified disorders of bone density and structure, multiple sites: Secondary | ICD-10-CM | POA: Diagnosis not present

## 2023-03-26 DIAGNOSIS — Z1331 Encounter for screening for depression: Secondary | ICD-10-CM | POA: Diagnosis not present

## 2023-04-01 DIAGNOSIS — M8588 Other specified disorders of bone density and structure, other site: Secondary | ICD-10-CM | POA: Diagnosis not present

## 2023-04-07 DIAGNOSIS — Z1211 Encounter for screening for malignant neoplasm of colon: Secondary | ICD-10-CM | POA: Diagnosis not present

## 2023-04-16 DIAGNOSIS — H524 Presbyopia: Secondary | ICD-10-CM | POA: Diagnosis not present

## 2023-04-16 DIAGNOSIS — H5203 Hypermetropia, bilateral: Secondary | ICD-10-CM | POA: Diagnosis not present

## 2023-04-21 ENCOUNTER — Inpatient Hospital Stay: Payer: No Typology Code available for payment source | Attending: Oncology | Admitting: Licensed Clinical Social Worker

## 2023-04-21 ENCOUNTER — Inpatient Hospital Stay: Payer: No Typology Code available for payment source

## 2023-04-21 ENCOUNTER — Encounter: Payer: Self-pay | Admitting: Licensed Clinical Social Worker

## 2023-04-21 DIAGNOSIS — Z79899 Other long term (current) drug therapy: Secondary | ICD-10-CM | POA: Diagnosis not present

## 2023-04-21 DIAGNOSIS — R739 Hyperglycemia, unspecified: Secondary | ICD-10-CM | POA: Diagnosis not present

## 2023-04-21 DIAGNOSIS — Z800 Family history of malignant neoplasm of digestive organs: Secondary | ICD-10-CM

## 2023-04-21 NOTE — Progress Notes (Signed)
REFERRING PROVIDER: Schermerhorn, Ihor Austin, MD [address] [facility] [place],  [place] [address]  PRIMARY PROVIDER:  Danella Penton, MD  PRIMARY REASON FOR VISIT:  1. Family history of colon cancer      HISTORY OF PRESENT ILLNESS:   Ms. Fretwell, a 72 y.o. female, was seen for a [place] cancer genetics consultation at the request of Dr. Feliberto Gottron due to a family history of colon cancer.  Ms. Kiernan presents to clinic today to discuss the possibility of a hereditary predisposition to cancer, genetic testing, and to further clarify her future cancer risks, as well as potential cancer risks for family members.   CANCER HISTORY:  Ms. Gieske is a 72 y.o. female with no personal history of cancer.    RISK FACTORS:  Menarche was at age 78.  First live birth at age 23.  Ovaries intact: yes.  Hysterectomy: no.  Menopausal status: postmenopausal.  HRT use: 0 years. Colonoscopy: yes;  every 5 years; reports less than 10 polyps . Mammogram within the last year: yes. Number of breast biopsies: 1.  Past Medical History:  Diagnosis Date   Diverticulitis    IBS (irritable bowel syndrome)    Renal disorder    kidney stone    Past Surgical History:  Procedure Laterality Date   BLADDER SURGERY  04/2013   Bladder sling and tack   BREAST EXCISIONAL BIOPSY Left 1992   benign   BREAST SURGERY      FAMILY HISTORY:  We obtained a detailed, 4-generation family history.  Significant diagnoses are listed below: Family History  Problem Relation Age of Onset   Colon cancer Mother 40   Heart disease Father 106   Colon cancer Maternal Aunt 78 - 65   Colon cancer Maternal Aunt 50 - 55   Lung cancer Maternal Aunt 9 - 90   Lung cancer Maternal Uncle    Cancer Maternal Grandmother        unk type   Breast cancer Neg Hx     Ms. Gadsden has 2 sons and 1 daughter, no cancers. She does not have siblings.  Ms. Davee's mother passed recently at 100. She had colon cancer at  4. Two maternal aunts also had colon cancer, one in her early 30s, the other in her early 67s. Another aunt had lung cancer. An uncle had lung cancer. A maternal cousin had breast cancer in her 72s-60s. Maternal grandmother died of cancer, unknown type.  Ms. Tomlinson's father passed at 70 due to heart issues. He had history of leukoplakia on his lip. No known cancers on this side of the family.  Ms. Quilter is unaware of previous family history of genetic testing for hereditary cancer risks. There is no reported Ashkenazi Jewish ancestry. There is no known consanguinity.    GENETIC COUNSELING ASSESSMENT: Ms. Gonsalves is a 72 y.o. female with a family history of colon cancer which is somewhat suggestive of a hereditary cancer syndrome and predisposition to cancer. We, therefore, discussed and recommended the following at today's visit.   DISCUSSION: We discussed that approximately 10% of colon cancer is hereditary. Most cases of hereditary colorectal cancer are associated with Lynch syndrome genes, although there are other genes associated with hereditary cancer as well. Cancers and risks are gene specific. We discussed that testing is beneficial for several reasons including knowing about cancer risks, identifying potential screening and risk-reduction options that may be appropriate, and to understand if other family members could be at risk  for cancer and allow them to undergo genetic testing.   We reviewed the characteristics, features and inheritance patterns of hereditary cancer syndromes. We also discussed genetic testing, including the appropriate family members to test, the process of testing, insurance coverage and turn-around-time for results. We discussed the implications of a negative, positive and/or variant of uncertain significant result. We recommended Ms. Newburg pursue genetic testing for the [facility] Common Hereditary Cancers+RNA gene panel.   Based on Ms. Branan's family history of cancer, she  meets medical criteria for genetic testing. Despite that she meets criteria, she may still have an out of pocket cost.  PLAN: After considering the risks, benefits, and limitations, Ms. Skillin provided informed consent to pursue genetic testing and the blood sample was sent to [facility] for analysis of the Common Hereditary Cancers+RNA panel. Results should be available within approximately 2-3 weeks' time, at which point they will be disclosed by telephone to Ms. Fiscus, as will any additional recommendations warranted by these results. Ms. Kilner will receive a summary of her genetic counseling visit and a copy of her results once available. This information will also be available in Epic.   Ms. Todisco's questions were answered to her satisfaction today. Our contact information was provided should additional questions or concerns arise. Thank you for the referral and allowing [place] to share in the care of your patient.   Lacy Duverney, MS, [facility] Genetic Counselor [email or web address] Phone: [phone]  The patient was seen for a total of 25 minutes in face-to-face genetic counseling.  Dr. Blake Divine was available for discussion regarding this case.   _______________________________________________________________________ For Office Staff:  Number of people involved in session: 1 Was an Intern/ student involved with case: no

## 2023-04-22 ENCOUNTER — Encounter: Payer: No Typology Code available for payment source | Admitting: Licensed Clinical Social Worker

## 2023-04-22 ENCOUNTER — Other Ambulatory Visit: Payer: No Typology Code available for payment source

## 2023-04-28 DIAGNOSIS — Z23 Encounter for immunization: Secondary | ICD-10-CM | POA: Diagnosis not present

## 2023-04-28 DIAGNOSIS — E782 Mixed hyperlipidemia: Secondary | ICD-10-CM | POA: Diagnosis not present

## 2023-04-28 DIAGNOSIS — F330 Major depressive disorder, recurrent, mild: Secondary | ICD-10-CM | POA: Diagnosis not present

## 2023-04-28 DIAGNOSIS — R739 Hyperglycemia, unspecified: Secondary | ICD-10-CM | POA: Diagnosis not present

## 2023-04-28 DIAGNOSIS — Z0000 Encounter for general adult medical examination without abnormal findings: Secondary | ICD-10-CM | POA: Diagnosis not present

## 2023-04-28 DIAGNOSIS — M8588 Other specified disorders of bone density and structure, other site: Secondary | ICD-10-CM | POA: Diagnosis not present

## 2023-04-28 DIAGNOSIS — I4719 Other supraventricular tachycardia: Secondary | ICD-10-CM | POA: Diagnosis not present

## 2023-04-28 DIAGNOSIS — Z1331 Encounter for screening for depression: Secondary | ICD-10-CM | POA: Diagnosis not present

## 2023-05-05 ENCOUNTER — Ambulatory Visit: Payer: Self-pay | Admitting: Licensed Clinical Social Worker

## 2023-05-05 ENCOUNTER — Encounter: Payer: Self-pay | Admitting: Licensed Clinical Social Worker

## 2023-05-05 ENCOUNTER — Telehealth: Payer: Self-pay | Admitting: Licensed Clinical Social Worker

## 2023-05-05 DIAGNOSIS — Z1379 Encounter for other screening for genetic and chromosomal anomalies: Secondary | ICD-10-CM

## 2023-05-05 NOTE — Progress Notes (Signed)
HPI:   Ms. Carolyn Cunningham was previously seen in the [facility] [facility] clinic due to a family history of cancer and concerns regarding a hereditary predisposition to cancer. Please refer to our prior cancer genetics clinic note for more information regarding our discussion, assessment and recommendations, at the time. Ms. Carolyn Cunningham recent genetic test results were disclosed to her, as were recommendations warranted by these results. These results and recommendations are discussed in more detail below.  CANCER HISTORY:  Oncology History   No history exists.    FAMILY HISTORY:  We obtained a detailed, 4-generation family history.  Significant diagnoses are listed below: Family History  Problem Relation Age of Onset   Colon cancer Mother 16   Heart disease Father 66   Colon cancer Maternal Aunt 52 - 65   Colon cancer Maternal Aunt 50 - 55   Lung cancer Maternal Aunt 44 - 90   Lung cancer Maternal Uncle    Cancer Maternal Grandmother        unk type   Breast cancer Neg Hx   Ms. Carolyn Cunningham has 2 sons and 1 daughter, no cancers. She does not have siblings.   Ms. Carolyn Cunningham's mother passed recently at 100. She had colon cancer at 19. Two maternal aunts also had colon cancer, one in her early 97s, the other in her early 28s. Another aunt had lung cancer. An uncle had lung cancer. A maternal cousin had breast cancer in her 54s-60s. Maternal grandmother died of cancer, unknown type.   Ms. Carolyn Cunningham's father passed at 9 due to heart issues. He had history of leukoplakia on his lip. No known cancers on this side of the family.   Ms. Carolyn Cunningham is unaware of previous family history of genetic testing for hereditary cancer risks. There is no reported Ashkenazi Jewish ancestry. There is no known consanguinity.      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   The Common Hereditary Cancers Panel + RNA offered by [facility] includes sequencing  and/or deletion duplication testing of the following 48 genes: APC*, ATM*, AXIN2, BAP1, BARD1, BMPR1A, BRCA1, BRCA2, BRIP1, CDH1, CDK4, CDKN2A (p14ARF), CDKN2A (p16INK4a), CHEK2, CTNNA1, DICER1*, EPCAM*, FH*, GREM1*, HOXB13, KIT, MBD4, MEN1*, MLH1*, MSH2*, MSH3*, MSH6*, MUTYH, NF1*, NTHL1, PALB2, PDGFRA, PMS2*, POLD1*, POLE, PTEN*, RAD51C, RAD51D, SDHA*, SDHB, SDHC*, SDHD, SMAD4, SMARCA4, STK11, TP53, TSC1*, TSC2, VHL.  The test report has been scanned into EPIC and is located under the Molecular Pathology section of the Results Review tab.  A portion of the result report is included below for reference. Genetic testing reported out on 05/02/2023.      Even though a pathogenic variant was not identified, possible explanations for the cancer in the family may include: There may be no hereditary risk for cancer in the family. The cancers in Ms. Carolyn Cunningham and/or her family may be sporadic/familial or due to other genetic and environmental factors. There may be a gene mutation in one of these genes that current testing methods cannot detect but that chance is small. There could be another gene that has not yet been discovered, or that we have not yet tested, that is responsible for the cancer diagnoses in the family.  It is also possible there is a hereditary cause for the cancer in the family that Ms. Carolyn Cunningham did not inherit.  Therefore, it is important to remain in touch with cancer genetics in the future so that we can continue  to offer Ms. Carolyn Cunningham the most up to date genetic testing.   NTHL1 Carrier A carrier result means that a genetic change (pathogenic variant)  was identified in one of the two copies of the NTHL1 gene. Being a NTHL1 carrier does not cause NTHL1-associated polyposis, which occurs only with two pathogenic variants (recessive inheritance). NTHL1-associated polyposis increases risk for colon polyps and colon cancer.   ADDITIONAL GENETIC TESTING:  We discussed with Ms. Carolyn Cunningham that her genetic  testing was fairly extensive.  If there are additional relevant genes identified to increase cancer risk that can be analyzed in the future, we would be happy to discuss and coordinate this testing at that time.    CANCER SCREENING RECOMMENDATIONS:  Ms. Carolyn Cunningham's test result is considered negative (normal).  This means that we have not identified a hereditary cause for her family history of cancer at this time.   An individual's cancer risk and medical management are not determined by genetic test results alone. Overall cancer risk assessment incorporates additional factors, including personal medical history, family history, and any available genetic information that may result in a personalized plan for cancer prevention and surveillance. Therefore, it is recommended she continue to follow the cancer management and screening guidelines provided by her primary healthcare provider.  RECOMMENDATIONS FOR FAMILY MEMBERS:   Her children/family members can consider testing for the NTHL1 mutation to see if they are also carriers. People in the family may also have NTHL1-associated polyposis if they inherited 2 NTHL1 mutations, one from each parent. Individuals in this family might be at some increased risk of developing cancer, over the general population risk, due to the family history of cancer.  Individuals in the family should notify their providers of the family history of cancer. We recommend women in this family have a yearly mammogram beginning at age 57, or 15 years younger than the earliest onset of cancer, an annual clinical breast exam, and perform monthly breast self-exams.  Family members should have colonoscopies by at age 40, or earlier, as recommended by their providers. Other members of the family may still carry a pathogenic variant in one of these genes that Ms. Carolyn Cunningham did not inherit. Based on the family history, we recommend her maternal relatives have genetic counseling and testing. Ms. Carolyn Cunningham  will let us know if we can be of any assistance in coordinating genetic counseling and/or testing for this family member.     FOLLOW-UP:  Lastly, we discussed with Ms. Carolyn Cunningham that cancer genetics is a rapidly advancing field and it is possible that new genetic tests will be appropriate for her and/or her family members in the future. We encouraged her to remain in contact with cancer genetics on an annual basis so we can update her personal and family histories and let her know of advances in cancer genetics that may benefit this family.   Our contact number was provided. Ms. Carolyn Cunningham's questions were answered to her satisfaction, and she knows she is welcome to call us at anytime with additional questions or concerns.    Lacy Duverney, MS, [facility] Genetic Counselor [email or web address] Phone: [phone]'

## 2023-05-05 NOTE — Telephone Encounter (Signed)
I contacted Ms. Carolyn Cunningham to discuss her genetic testing results. Signle pathogenic variant in NTHL1 called c.268C>T identified. NTHL1 is associated with autosomal recessive NTHL1-associated polyposis, therefore Ms. Carolyn Cunningham is a carrier of this condition and does not have it herself. Detailed clinic note to follow.   The test report has been scanned into EPIC and is located under the Molecular Pathology section of the Results Review tab.  A portion of the result report is included below for reference.      Lacy Duverney, MS, [facility] Genetic Counselor [email or web address] Phone: [phone]

## 2023-05-11 ENCOUNTER — Encounter: Payer: Self-pay | Admitting: Dermatology

## 2023-05-11 ENCOUNTER — Ambulatory Visit: Payer: No Typology Code available for payment source | Admitting: Dermatology

## 2023-05-11 DIAGNOSIS — L821 Other seborrheic keratosis: Secondary | ICD-10-CM

## 2023-05-11 DIAGNOSIS — L820 Inflamed seborrheic keratosis: Secondary | ICD-10-CM

## 2023-05-11 DIAGNOSIS — L603 Nail dystrophy: Secondary | ICD-10-CM | POA: Diagnosis not present

## 2023-05-11 DIAGNOSIS — C44729 Squamous cell carcinoma of skin of left lower limb, including hip: Secondary | ICD-10-CM

## 2023-05-11 DIAGNOSIS — D485 Neoplasm of uncertain behavior of skin: Secondary | ICD-10-CM

## 2023-05-11 DIAGNOSIS — C4492 Squamous cell carcinoma of skin, unspecified: Secondary | ICD-10-CM

## 2023-05-11 DIAGNOSIS — L570 Actinic keratosis: Secondary | ICD-10-CM | POA: Diagnosis not present

## 2023-05-11 DIAGNOSIS — L578 Other skin changes due to chronic exposure to nonionizing radiation: Secondary | ICD-10-CM

## 2023-05-11 DIAGNOSIS — B965 Pseudomonas (aeruginosa) (mallei) (pseudomallei) as the cause of diseases classified elsewhere: Secondary | ICD-10-CM

## 2023-05-11 DIAGNOSIS — W908XXA Exposure to other nonionizing radiation, initial encounter: Secondary | ICD-10-CM | POA: Diagnosis not present

## 2023-05-11 DIAGNOSIS — D492 Neoplasm of unspecified behavior of bone, soft tissue, and skin: Secondary | ICD-10-CM | POA: Diagnosis not present

## 2023-05-11 DIAGNOSIS — A498 Other bacterial infections of unspecified site: Secondary | ICD-10-CM

## 2023-05-11 HISTORY — DX: Squamous cell carcinoma of skin, unspecified: C44.92

## 2023-05-11 NOTE — Progress Notes (Signed)
Follow-Up Visit   Subjective  Carolyn Cunningham is a 72 y.o. female who presents for the following: Irregular skin lesion on the L calf and R upper arm that has been present since July 2024. Irregular toenail, R great toe, pt dropped a can on it many years ago and the nail will periodically fall off or she'll cut it off. Recently she noticed a dark discoloration at the nail bed.  The patient has spots, moles and lesions to be evaluated, some may be new or changing and the patient may have concern these could be cancer.  The following portions of the chart were reviewed this encounter and updated as appropriate: medications, allergies, medical history  Review of Systems:  No other skin or systemic complaints except as noted in HPI or Assessment and Plan.  Objective  Well appearing patient in no apparent distress; mood and affect are within normal limits.    A focused examination was performed of the following areas:   Relevant exam findings are noted in the Assessment and Plan.  L lower ant leg 1.0 x 0.9 cm indurated pink papule         L pretibial Crusted papule.  R upper arm x 1 Erythematous stuck-on, waxy papule or plaque  Chest x 4 (4) Erythematous thin papules/macules with gritty scale.     Assessment & Plan     Neoplasm of uncertain behavior of skin (2) L lower ant leg  Skin / nail biopsy Type of biopsy: tangential   Informed consent: discussed and consent obtained   Timeout: patient name, date of birth, surgical site, and procedure verified   Procedure prep:  Patient was prepped and draped in usual sterile fashion Prep type:  Isopropyl alcohol Anesthesia: the lesion was anesthetized in a standard fashion   Anesthetic:  1% lidocaine w/ epinephrine 1-100,000 buffered w/ 8.4% NaHCO3 Instrument used: flexible razor blade   Hemostasis achieved with: pressure, aluminum chloride and electrodesiccation   Outcome: patient tolerated procedure well    Post-procedure details: sterile dressing applied and wound care instructions given   Dressing type: bandage and petrolatum    Specimen 1 - Surgical pathology Differential Diagnosis: D48.5 r/o SCC Check Margins: No  L pretibial  Recommend Mohs if positive for SCC on the L calf. Will watch the papule on the L pretibial as it may be a shaving and benign.  Inflamed seborrheic keratosis R upper arm x 1  Destruction of lesion - R upper arm x 1 Complexity: simple   Destruction method: cryotherapy   Informed consent: discussed and consent obtained   Timeout:  patient name, date of birth, surgical site, and procedure verified Lesion destroyed using liquid nitrogen: Yes   Region frozen until ice ball extended beyond lesion: Yes   Outcome: patient tolerated procedure well with no complications   Post-procedure details: wound care instructions given    AK (actinic keratosis) (4) Chest x 4  Actinic keratoses are precancerous spots that appear secondary to cumulative UV radiation exposure/sun exposure over time. They are chronic with expected duration over 1 year. A portion of actinic keratoses will progress to squamous cell carcinoma of the skin. It is not possible to reliably predict which spots will progress to skin cancer and so treatment is recommended to prevent development of skin cancer.  Recommend daily broad spectrum sunscreen SPF 30+ to sun-exposed areas, reapply every 2 hours as needed.  Recommend staying in the shade or wearing long sleeves, sun glasses (UVA+UVB protection) and wide  brim hats (4-inch brim around the entire circumference of the hat). Call for new or changing lesions.  Recommend CeraVe anti itch cream daily PRN itch. Samples given.  Destruction of lesion - Chest x 4 (4) Complexity: simple   Destruction method: cryotherapy   Informed consent: discussed and consent obtained   Timeout:  patient name, date of birth, surgical site, and procedure verified Lesion  destroyed using liquid nitrogen: Yes   Region frozen until ice ball extended beyond lesion: Yes   Outcome: patient tolerated procedure well with no complications   Post-procedure details: wound care instructions given    ACTINIC DAMAGE - chronic, secondary to cumulative UV radiation exposure/sun exposure over time - diffuse scaly erythematous macules with underlying dyspigmentation - Recommend daily broad spectrum sunscreen SPF 30+ to sun-exposed areas, reapply every 2 hours as needed.  - Recommend staying in the shade or wearing long sleeves, sun glasses (UVA+UVB protection) and wide brim hats (4-inch brim around the entire circumference of the hat). - Call for new or changing lesions.  SEBORRHEIC KERATOSIS - Stuck-on, waxy, tan-brown papules and/or plaques  - Benign-appearing - Discussed benign etiology and prognosis. - Observe - Call for any changes  NAIL PROBLEM with pseudomonas bacteria of the R great toenail. Exam: dystrophic nail due to trauma with pseudomonas superinfection  Treatment Plan: Continue vinegar soaks daily. Do not scrape under the nail with scissors as it could damage the fragile skin under the nail, damaging the nail matrix even further. Discussed with pt that once nail matrix is damaged there aren't good treatment options to repair it.   Return for appointment as scheduled.  Maylene Roes, CMA, am acting as scribe for Elie Goody, MD .   Documentation: I have reviewed the above documentation for accuracy and completeness, and I agree with the above.  Elie Goody, MD

## 2023-05-11 NOTE — Patient Instructions (Addendum)
Wound Care Instructions  Cleanse wound gently with soap and water once a day then pat dry with clean gauze. Apply a thin coat of Petrolatum (petroleum jelly, "Vaseline") over the wound (unless you have an allergy to this). We recommend that you use a new, sterile tube of Vaseline. Do not pick or remove scabs. Do not remove the yellow or white "healing tissue" from the base of the wound.  Cover the wound with fresh, clean, nonstick gauze and secure with paper tape. You may use Band-Aids in place of gauze and tape if the wound is small enough, but would recommend trimming much of the tape off as there is often too much. Sometimes Band-Aids can irritate the skin.  You should call the office for your biopsy report after 1 week if you have not already been contacted.  If you experience any problems, such as abnormal amounts of bleeding, swelling, significant bruising, significant pain, or evidence of infection, please call the office immediately.  FOR ADULT SURGERY PATIENTS: If you need something for pain relief you may take 1 extra strength Tylenol (acetaminophen) AND 2 Ibuprofen (200mg  each) together every 4 hours as needed for pain. (do not take these if you are allergic to them or if you have a reason you should not take them.) Typically, you may only need pain medication for 1 to 3 days.     Due to recent changes in healthcare laws, you may see results of your pathology and/or laboratory studies on MyChart before the doctors have had a chance to review them. We understand that in some cases there may be results that are confusing or concerning to you. Please understand that not all results are received at the same time and often the doctors may need to interpret multiple results in order to provide you with the best plan of care or course of treatment. Therefore, we ask that you please give [place] 2 business days to thoroughly review all your results before contacting the office for clarification. Should  we see a critical lab result, you will be contacted sooner.   If You Need Anything After Your Visit  If you have any questions or concerns for your doctor, please call our main line at [phone] and press option 4 to reach your doctor's medical assistant. If no one answers, please leave a voicemail as directed and we will return your call as soon as possible. Messages left after 4 pm will be answered the following business day.   You may also send [place] a message via MyChart. We typically respond to MyChart messages within 1-2 business days.  For prescription refills, please ask your pharmacy to contact our office. Our fax number is [phone].  If you have an urgent issue when the clinic is closed that cannot wait until the next business day, you can [place] your doctor at the number below.    Please note that while we do our best to be available for urgent issues outside of office hours, we are not available 24/7.   If you have an urgent issue and are unable to reach [place], you may choose to seek medical care at your doctor's office, retail clinic, urgent care center, or emergency room.  If you have a medical emergency, please immediately call 911 or go to the emergency department.  Pager Numbers  - Dr. Gwen Pounds: [phone]  - Dr. Roseanne Reno: [phone]  - Dr. Katrinka Blazing: [phone]   In the event of inclement weather, please call our main line  at [phone] for an update on the status of any delays or closures.  Dermatology Medication Tips: Please keep the boxes that topical medications come in in order to help keep track of the instructions about where and how to use these. Pharmacies typically print the medication instructions only on the boxes and not directly on the medication tubes.   If your medication is too expensive, please contact our office at [phone] option 4 or send [place] a message through MyChart.   We are unable to tell what your co-pay for medications will be in  advance as this is different depending on your insurance coverage. However, we may be able to find a substitute medication at lower cost or fill out paperwork to get insurance to cover a needed medication.   If a prior authorization is required to get your medication covered by your insurance company, please allow [place] 1-2 business days to complete this process.  Drug prices often vary depending on where the prescription is filled and some pharmacies may offer cheaper prices.  The website www.goodrx.com contains coupons for medications through different pharmacies. The prices here do not account for what the cost may be with help from insurance (it may be cheaper with your insurance), but the website can give you the price if you did not use any insurance.  - You can print the associated coupon and take it with your prescription to the pharmacy.  - You may also stop by our office during regular business hours and pick up a GoodRx coupon card.  - If you need your prescription sent electronically to a different pharmacy, notify our office through [facility] or by phone at [phone] option 4.     Si Usted Necesita Algo Despus de Su Visita  Tambin puede enviarnos un mensaje a travs de Clinical cytogeneticist. Por lo general respondemos a los mensajes de MyChart en el transcurso de 1 a 2 das hbiles.  Para renovar recetas, por favor pida a su farmacia que se ponga en contacto con nuestra oficina. Annie Sable de fax es [place] [phone].  Si tiene un asunto urgente cuando la clnica est cerrada y que no puede esperar hasta el siguiente da hbil, puede llamar/localizar a su doctor(a) al nmero que aparece a continuacin.   Por favor, tenga en cuenta que aunque hacemos todo lo posible para estar disponibles para asuntos urgentes fuera del horario de [place], no estamos disponibles las 24 horas del da, los 7 [address]  [address] de la [place].   Si tiene un problema urgente y no puede comunicarse con nosotros, puede  optar por buscar atencin mdica  en el consultorio de su doctor(a), en una clnica privada, en un centro de atencin urgente o en una sala de emergencias.  Si tiene Engineer, drilling, por favor llame inmediatamente al 911 o vaya a la sala de emergencias.  Nmeros de bper  - Dr. Gwen Pounds: [phone]  - Dra. Roseanne Reno: [phone]  - Dr. Katrinka Blazing: [phone]   En caso de inclemencias del tiempo, por favor llame a Lacy Duverney principal al [phone] para una actualizacin sobre el [place] de cualquier retraso o cierre.  Consejos para la medicacin en dermatologa: Por favor, guarde las cajas en las que vienen los medicamentos de uso tpico para ayudarle a seguir las instrucciones sobre dnde y cmo usarlos. Las farmacias generalmente imprimen las instrucciones del medicamento slo en las cajas y no directamente en los tubos del [place].   Si su medicamento es [employer], por favor, pngase en  contacto con Rolm Gala llamando al [phone] y presione la opcin 4 o envenos un mensaje a travs de Clinical cytogeneticist.   No podemos decirle cul ser su copago por los medicamentos por adelantado ya que esto es diferente dependiendo de la cobertura de su seguro. Sin embargo, es posible que podamos encontrar un medicamento sustituto a Audiological scientist un formulario para que el seguro cubra el medicamento que se considera necesario.   Si se requiere una autorizacin previa para que su compaa de seguros [place] su medicamento, por favor permtanos de 1 a 2 das hbiles para completar [address]  Los precios de los medicamentos varan con frecuencia dependiendo del Environmental consultant de dnde se surte la receta y alguna farmacias pueden ofrecer precios ms baratos.  El sitio web www.goodrx.com tiene cupones para medicamentos de Health and safety inspector. Los precios aqu no tienen en cuenta lo que podra costar con la ayuda del seguro (puede ser ms barato con su seguro), pero el sitio web puede darle el  precio si no utiliz Tourist information centre manager.  - Puede imprimir el cupn correspondiente y llevarlo con su receta a la farmacia.  - Tambin puede pasar por nuestra oficina durante el horario de atencin regular y Education officer, museum una tarjeta de cupones de GoodRx.  - Si necesita que su receta se enve electrnicamente a una farmacia diferente, informe a nuestra oficina a travs de MyChart de [place] o por telfono llamando al [phone] y presione la opcin 4.

## 2023-05-13 LAB — SURGICAL PATHOLOGY

## 2023-05-14 ENCOUNTER — Ambulatory Visit
Admission: RE | Admit: 2023-05-14 | Discharge: 2023-05-14 | Disposition: A | Discharge status: Home / Self Care | Payer: No Typology Code available for payment source | Source: Ambulatory Visit | Attending: Obstetrics and Gynecology | Admitting: Obstetrics and Gynecology

## 2023-05-14 ENCOUNTER — Telehealth: Payer: Self-pay

## 2023-05-14 DIAGNOSIS — C44729 Squamous cell carcinoma of skin of left lower limb, including hip: Secondary | ICD-10-CM

## 2023-05-14 DIAGNOSIS — Z1231 Encounter for screening mammogram for malignant neoplasm of breast: Secondary | ICD-10-CM | POA: Insufficient documentation

## 2023-05-14 NOTE — Telephone Encounter (Signed)
-----   Message from [place] sent at 05/14/2023 10:50 AM EDT ----- Diagnosis: WELL DIFFERENTIATED SQUAMOUS CELL CARCINOMA  Please call with diagnosis and determine where the patient would like to have Mohs surgery.  Explanation: This is a squamous cell skin cancer that has grown beyond the surface of the skin and is invading the second layer of the skin. It has the potential to spread beyond the skin and threaten your health, so I recommend treating it.  Treatment: Given the location and type of skin cancer, I recommend Mohs surgery. Mohs surgery involves cutting out the skin cancer and then checking under the microscope to ensure the whole skin cancer was removed. If any skin cancer remains, the surgeon will cut out more until it is fully removed. The cure rate is about 98-99%. Once the Mohs surgeon confirms the skin cancer is out, they will discuss the options to repair or heal the area. You must take it easy for about two weeks after surgery (no lifting over 10-15 lbs, avoid activity to get your heart rate and blood pressure up). It is done at another office outside of [place] ([place], [place], or [place]).  If the patient asks for my recommendation for Mohs surgeon, I recommend Dr Caprice Beaver or Dr Coralie Carpen at [facility] if the patient is able to make the trip.

## 2023-05-14 NOTE — Telephone Encounter (Signed)
Discussed pathology results. Patient voiced understanding. Discussed Mohs surgery and locations for surgery. Patient prefers [facility] in [place] due to closer location. Referral sent to [facility] with Dr. Jeannine Boga.

## 2023-05-18 DIAGNOSIS — D485 Neoplasm of uncertain behavior of skin: Secondary | ICD-10-CM | POA: Diagnosis not present

## 2023-05-18 DIAGNOSIS — I872 Venous insufficiency (chronic) (peripheral): Secondary | ICD-10-CM | POA: Diagnosis not present

## 2023-05-18 DIAGNOSIS — C44729 Squamous cell carcinoma of skin of left lower limb, including hip: Secondary | ICD-10-CM | POA: Diagnosis not present

## 2023-06-16 ENCOUNTER — Ambulatory Visit (INDEPENDENT_AMBULATORY_CARE_PROVIDER_SITE_OTHER): Payer: No Typology Code available for payment source | Admitting: Dermatology

## 2023-06-16 ENCOUNTER — Encounter: Payer: Self-pay | Admitting: Dermatology

## 2023-06-16 DIAGNOSIS — D492 Neoplasm of unspecified behavior of bone, soft tissue, and skin: Secondary | ICD-10-CM | POA: Diagnosis not present

## 2023-06-16 DIAGNOSIS — C44729 Squamous cell carcinoma of skin of left lower limb, including hip: Secondary | ICD-10-CM | POA: Diagnosis not present

## 2023-06-16 DIAGNOSIS — C4492 Squamous cell carcinoma of skin, unspecified: Secondary | ICD-10-CM

## 2023-06-16 DIAGNOSIS — D489 Neoplasm of uncertain behavior, unspecified: Secondary | ICD-10-CM

## 2023-06-16 DIAGNOSIS — D485 Neoplasm of uncertain behavior of skin: Secondary | ICD-10-CM

## 2023-06-16 HISTORY — DX: Squamous cell carcinoma of skin, unspecified: C44.92

## 2023-06-16 NOTE — Progress Notes (Signed)
   Follow-Up Visit   Subjective  Carolyn Cunningham is a 72 y.o. female who presents for the following: spot at left lower leg.   The patient has spots, moles and lesions to be evaluated, some may be new or changing and the patient may have concern these could be cancer.   The following portions of the chart were reviewed this encounter and updated as appropriate: medications, allergies, medical history  Review of Systems:  No other skin or systemic complaints except as noted in HPI or Assessment and Plan.  Objective  Well appearing patient in no apparent distress; mood and affect are within normal limits.   A focused examination was performed of the following areas: Left leg  Relevant exam findings are noted in the Assessment and Plan.  left lower anterior mid leg 9 mm pink keratotoic papule       Right Upper Cutaneous Lip Pink papule with pigment globules       Assessment & Plan     Neoplasm of uncertain behavior of skin left lower anterior mid leg  Skin / nail biopsy Type of biopsy: tangential   Informed consent: discussed and consent obtained   Timeout: patient name, date of birth, surgical site, and procedure verified   Procedure prep:  Patient was prepped and draped in usual sterile fashion Prep type:  Isopropyl alcohol Anesthesia: the lesion was anesthetized in a standard fashion   Anesthetic:  1% lidocaine w/ epinephrine 1-100,000 buffered w/ 8.4% NaHCO3 Instrument used: DermaBlade   Hemostasis achieved with: pressure and aluminum chloride   Outcome: patient tolerated procedure well   Post-procedure details: sterile dressing applied and wound care instructions given   Dressing type: bandage and petrolatum    Specimen 1 - Surgical pathology Differential Diagnosis: r/o SCC  Check Margins: No 9 mm pink keratotoic papule  Neoplasm of uncertain behavior Right Upper Cutaneous Lip  CLINICALLY CONSISTENT WITH BCC. Offered biopsy. Patient prefers to do  biopsy after Christmas. She is scheduled with Dr. Gwen Pounds in Jan 2025.    Return for TBSE, with Dr. Kirtland Bouchard, as scheduled, Biopsy.  Anise Salvo, RMA, am acting as scribe for Elie Goody, MD .   Documentation: I have reviewed the above documentation for accuracy and completeness, and I agree with the above.  Elie Goody, MD

## 2023-06-16 NOTE — Patient Instructions (Addendum)
Wound Care Instructions  Cleanse wound gently with soap and water once a day then pat dry with clean gauze. Apply a thin coat of Petrolatum (petroleum jelly, "Vaseline") over the wound (unless you have an allergy to this). We recommend that you use a new, sterile tube of Vaseline. Do not pick or remove scabs. Do not remove the yellow or white "healing tissue" from the base of the wound.  Cover the wound with fresh, clean, nonstick gauze and secure with paper tape. You may use Band-Aids in place of gauze and tape if the wound is small enough, but would recommend trimming much of the tape off as there is often too much. Sometimes Band-Aids can irritate the skin.  You should call the office for your biopsy report after 1 week if you have not already been contacted.  If you experience any problems, such as abnormal amounts of bleeding, swelling, significant bruising, significant pain, or evidence of infection, please call the office immediately.  FOR ADULT SURGERY PATIENTS: If you need something for pain relief you may take 1 extra strength Tylenol (acetaminophen) AND 2 Ibuprofen (200mg  each) together every 4 hours as needed for pain. (do not take these if you are allergic to them or if you have a reason you should not take them.) Typically, you may only need pain medication for 1 to 3 days.         Due to recent changes in healthcare laws, you may see results of your pathology and/or laboratory studies on MyChart before the doctors have had a chance to review them. We understand that in some cases there may be results that are confusing or concerning to you. Please understand that not all results are received at the same time and often the doctors may need to interpret multiple results in order to provide you with the best plan of care or course of treatment. Therefore, we ask that you please give [place] 2 business days to thoroughly review all your results before contacting the office for clarification.  Should we see a critical lab result, you will be contacted sooner.   If You Need Anything After Your Visit  If you have any questions or concerns for your doctor, please call our main line at [phone] and press option 4 to reach your doctor's medical assistant. If no one answers, please leave a voicemail as directed and we will return your call as soon as possible. Messages left after 4 pm will be answered the following business day.   You may also send [place] a message via MyChart. We typically respond to MyChart messages within 1-2 business days.  For prescription refills, please ask your pharmacy to contact our office. Our fax number is [phone].  If you have an urgent issue when the clinic is closed that cannot wait until the next business day, you can [place] your doctor at the number below.    Please note that while we do our best to be available for urgent issues outside of office hours, we are not available 24/7.   If you have an urgent issue and are unable to reach [place], you may choose to seek medical care at your doctor's office, retail clinic, urgent care center, or emergency room.  If you have a medical emergency, please immediately call 911 or go to the emergency department.  Pager Numbers  - Dr. Gwen Pounds: [phone]  - Dr. Roseanne Reno: [phone]  - Dr. Katrinka Blazing: [phone]   In the event of inclement weather, please  call our main line at [phone] for an update on the status of any delays or closures.  Dermatology Medication Tips: Please keep the boxes that topical medications come in in order to help keep track of the instructions about where and how to use these. Pharmacies typically print the medication instructions only on the boxes and not directly on the medication tubes.   If your medication is too expensive, please contact our office at [phone] option 4 or send [place] a message through MyChart.   We are unable to tell what your co-pay for medications will be  in advance as this is different depending on your insurance coverage. However, we may be able to find a substitute medication at lower cost or fill out paperwork to get insurance to cover a needed medication.   If a prior authorization is required to get your medication covered by your insurance company, please allow [place] 1-2 business days to complete this process.  Drug prices often vary depending on where the prescription is filled and some pharmacies may offer cheaper prices.  The website www.goodrx.com contains coupons for medications through different pharmacies. The prices here do not account for what the cost may be with help from insurance (it may be cheaper with your insurance), but the website can give you the price if you did not use any insurance.  - You can print the associated coupon and take it with your prescription to the pharmacy.  - You may also stop by our office during regular business hours and pick up a GoodRx coupon card.  - If you need your prescription sent electronically to a different pharmacy, notify our office through [facility] or by phone at [phone] option 4.     Si Usted Necesita Algo Despus de Su Visita  Tambin puede enviarnos un mensaje a travs de Clinical cytogeneticist. Por lo general respondemos a los mensajes de MyChart en el transcurso de 1 a 2 das hbiles.  Para renovar recetas, por favor pida a su farmacia que se ponga en contacto con nuestra oficina. Annie Sable de fax es [place] [phone].  Si tiene un asunto urgente cuando la clnica est cerrada y que no puede esperar hasta el siguiente da hbil, puede llamar/localizar a su doctor(a) al nmero que aparece a continuacin.   Por favor, tenga en cuenta que aunque hacemos todo lo posible para estar disponibles para asuntos urgentes fuera del horario de [place], no estamos disponibles las 24 horas del da, los 7 [address]  [address] de la [place].   Si tiene un problema urgente y no puede comunicarse con nosotros,  puede optar por buscar atencin mdica  en el consultorio de su doctor(a), en una clnica privada, en un centro de atencin urgente o en una sala de emergencias.  Si tiene Engineer, drilling, por favor llame inmediatamente al 911 o vaya a la sala de emergencias.  Nmeros de bper  - Dr. Gwen Pounds: [phone]  - Dra. Roseanne Reno: [phone]  - Dr. Katrinka Blazing: [phone]   En caso de inclemencias del tiempo, por favor llame a Lacy Duverney principal al [phone] para una actualizacin sobre el [place] de cualquier retraso o cierre.  Consejos para la medicacin en dermatologa: Por favor, guarde las cajas en las que vienen los medicamentos de uso tpico para ayudarle a seguir las instrucciones sobre dnde y cmo usarlos. Las farmacias generalmente imprimen las instrucciones del medicamento slo en las cajas y no directamente en los tubos del [place].   Si su medicamento es [employer],  por favor, pngase en contacto con nuestra oficina llamando al [phone] y presione la opcin 4 o envenos un mensaje a travs de Clinical cytogeneticist.   No podemos decirle cul ser su copago por los medicamentos por adelantado ya que esto es diferente dependiendo de la cobertura de su seguro. Sin embargo, es posible que podamos encontrar un medicamento sustituto a Audiological scientist un formulario para que el seguro cubra el medicamento que se considera necesario.   Si se requiere una autorizacin previa para que su compaa de seguros [place] su medicamento, por favor permtanos de 1 a 2 das hbiles para completar [address]  Los precios de los medicamentos varan con frecuencia dependiendo del Environmental consultant de dnde se surte la receta y alguna farmacias pueden ofrecer precios ms baratos.  El sitio web www.goodrx.com tiene cupones para medicamentos de Health and safety inspector. Los precios aqu no tienen en cuenta lo que podra costar con la ayuda del seguro (puede ser ms barato con su seguro), pero el sitio web puede darle  el precio si no utiliz Tourist information centre manager.  - Puede imprimir el cupn correspondiente y llevarlo con su receta a la farmacia.  - Tambin puede pasar por nuestra oficina durante el horario de atencin regular y Education officer, museum una tarjeta de cupones de GoodRx.  - Si necesita que su receta se enve electrnicamente a una farmacia diferente, informe a nuestra oficina a travs de MyChart de [place] o por telfono llamando al [phone] y presione la opcin 4.

## 2023-06-19 LAB — SURGICAL PATHOLOGY

## 2023-06-22 ENCOUNTER — Telehealth: Payer: Self-pay

## 2023-06-22 DIAGNOSIS — C44729 Squamous cell carcinoma of skin of left lower limb, including hip: Secondary | ICD-10-CM

## 2023-06-22 NOTE — Telephone Encounter (Signed)
Patient advised pathology showed SCC, [facility] for [facility]s. Butch Penny., RMA

## 2023-06-22 NOTE — Telephone Encounter (Signed)
-----   Message from [place] sent at 06/19/2023  5:15 PM EST ----- Diagnosis: left lower anterior mid leg :       SQUAMOUS CELL CARCINOMA, KERATOACANTHOMA TYPE    Please call with diagnosis and [facility] ([facility]).  Explanation: This is a squamous cell skin cancer that has grown beyond the surface of the skin and is invading the second layer of the skin. It has the potential to spread beyond the skin and threaten your health, so I recommend treating it.  Treatment: Given the location and type of skin cancer, I recommend Mohs surgery.

## 2023-07-09 ENCOUNTER — Telehealth: Payer: Self-pay

## 2023-07-09 DIAGNOSIS — I872 Venous insufficiency (chronic) (peripheral): Secondary | ICD-10-CM | POA: Diagnosis not present

## 2023-07-09 DIAGNOSIS — C44729 Squamous cell carcinoma of skin of left lower limb, including hip: Secondary | ICD-10-CM | POA: Diagnosis not present

## 2023-07-09 NOTE — Telephone Encounter (Signed)
Specimen tracking and history updated from [facility] progress notes of left lower leg on 05/18/23. aw

## 2023-07-13 ENCOUNTER — Telehealth: Payer: Self-pay

## 2023-07-13 NOTE — Telephone Encounter (Signed)
Progress notes from [facility] in media. aw

## 2023-08-05 ENCOUNTER — Ambulatory Visit: Payer: No Typology Code available for payment source | Admitting: Dermatology

## 2023-08-10 ENCOUNTER — Telehealth: Payer: Self-pay

## 2023-08-10 NOTE — Telephone Encounter (Signed)
Updated specimen tracking and history from [facility] progress notes of SECOND surgery to left lower anterior mid leg. aw

## 2023-08-13 ENCOUNTER — Ambulatory Visit: Payer: Medicare Other | Admitting: Dermatology

## 2023-08-13 ENCOUNTER — Encounter: Payer: Self-pay | Admitting: Dermatology

## 2023-08-13 DIAGNOSIS — C4491 Basal cell carcinoma of skin, unspecified: Secondary | ICD-10-CM

## 2023-08-13 DIAGNOSIS — D492 Neoplasm of unspecified behavior of bone, soft tissue, and skin: Secondary | ICD-10-CM | POA: Diagnosis not present

## 2023-08-13 DIAGNOSIS — D1801 Hemangioma of skin and subcutaneous tissue: Secondary | ICD-10-CM

## 2023-08-13 DIAGNOSIS — L719 Rosacea, unspecified: Secondary | ICD-10-CM

## 2023-08-13 DIAGNOSIS — C4401 Basal cell carcinoma of skin of lip: Secondary | ICD-10-CM

## 2023-08-13 DIAGNOSIS — L820 Inflamed seborrheic keratosis: Secondary | ICD-10-CM

## 2023-08-13 DIAGNOSIS — W908XXA Exposure to other nonionizing radiation, initial encounter: Secondary | ICD-10-CM

## 2023-08-13 DIAGNOSIS — L814 Other melanin hyperpigmentation: Secondary | ICD-10-CM

## 2023-08-13 DIAGNOSIS — L578 Other skin changes due to chronic exposure to nonionizing radiation: Secondary | ICD-10-CM

## 2023-08-13 DIAGNOSIS — D225 Melanocytic nevi of trunk: Secondary | ICD-10-CM

## 2023-08-13 DIAGNOSIS — L570 Actinic keratosis: Secondary | ICD-10-CM

## 2023-08-13 DIAGNOSIS — D229 Melanocytic nevi, unspecified: Secondary | ICD-10-CM

## 2023-08-13 DIAGNOSIS — L821 Other seborrheic keratosis: Secondary | ICD-10-CM

## 2023-08-13 DIAGNOSIS — Z1283 Encounter for screening for malignant neoplasm of skin: Secondary | ICD-10-CM

## 2023-08-13 DIAGNOSIS — D485 Neoplasm of uncertain behavior of skin: Secondary | ICD-10-CM

## 2023-08-13 DIAGNOSIS — Z85828 Personal history of other malignant neoplasm of skin: Secondary | ICD-10-CM

## 2023-08-13 HISTORY — DX: Basal cell carcinoma of skin, unspecified: C44.91

## 2023-08-13 MED ORDER — METRONIDAZOLE 0.75 % EX CREA
TOPICAL_CREAM | Freq: Two times a day (BID) | CUTANEOUS | 5 refills | Status: AC
Start: 2023-08-13 — End: 2024-08-12

## 2023-08-13 NOTE — Patient Instructions (Signed)
Cryotherapy Aftercare  Wash gently with soap and water everyday.   Apply Vaseline and Band-Aid daily until healed.    Wound Care Instructions  Cleanse wound gently with soap and water once a day then pat dry with clean gauze. Apply a thin coat of Petrolatum (petroleum jelly, "Vaseline") over the wound (unless you have an allergy to this). We recommend that you use a new, sterile tube of Vaseline. Do not pick or remove scabs. Do not remove the yellow or white "healing tissue" from the base of the wound.  Cover the wound with fresh, clean, nonstick gauze and secure with paper tape. You may use Band-Aids in place of gauze and tape if the wound is small enough, but would recommend trimming much of the tape off as there is often too much. Sometimes Band-Aids can irritate the skin.  You should call the office for your biopsy report after 1 week if you have not already been contacted.  If you experience any problems, such as abnormal amounts of bleeding, swelling, significant bruising, significant pain, or evidence of infection, please call the office immediately.  FOR ADULT SURGERY PATIENTS: If you need something for pain relief you may take 1 extra strength Tylenol (acetaminophen) AND 2 Ibuprofen (200mg  each) together every 4 hours as needed for pain. (do not take these if you are allergic to them or if you have a reason you should not take them.) Typically, you may only need pain medication for 1 to 3 days.   Melanoma ABCDEs  Melanoma is the most dangerous type of skin cancer, and is the leading cause of death from skin disease.  You are more likely to develop melanoma if you: Have light-colored skin, light-colored eyes, or red or blond hair Spend a lot of time in the sun Tan regularly, either outdoors or in a tanning bed Have had blistering sunburns, especially during childhood Have a close family member who has had a melanoma Have atypical moles or large birthmarks  Early detection of  melanoma is key since treatment is typically straightforward and cure rates are extremely high if we catch it early.   The first sign of melanoma is often a change in a mole or a new dark spot.  The ABCDE system is a way of remembering the signs of melanoma.  A for asymmetry:  The two halves do not match. B for border:  The edges of the growth are irregular. C for color:  A mixture of colors are present instead of an even brown color. D for diameter:  Melanomas are usually (but not always) greater than 6mm - the size of a pencil eraser. E for evolution:  The spot keeps changing in size, shape, and color.  Please check your skin once per month between visits. You can use a small mirror in front and a large mirror behind you to keep an eye on the back side or your body.   If you see any new or changing lesions before your next follow-up, please call to schedule a visit.  Please continue daily skin protection including broad spectrum sunscreen SPF 30+ to sun-exposed areas, reapplying every 2 hours as needed when you're outdoors.    Due to recent changes in healthcare laws, you may see results of your pathology and/or laboratory studies on MyChart before the doctors have had a chance to review them. We understand that in some cases there may be results that are confusing or concerning to you. Please understand that not all  results are received at the same time and often the doctors may need to interpret multiple results in order to provide you with the best plan of care or course of treatment. Therefore, we ask that you please give [place] 2 business days to thoroughly review all your results before contacting the office for clarification. Should we see a critical lab result, you will be contacted sooner.   If You Need Anything After Your Visit  If you have any questions or concerns for your doctor, please call our main line at [phone] and press option 4 to reach your doctor's medical assistant. If  no one answers, please leave a voicemail as directed and we will return your call as soon as possible. Messages left after 4 pm will be answered the following business day.   You may also send [place] a message via MyChart. We typically respond to MyChart messages within 1-2 business days.  For prescription refills, please ask your pharmacy to contact our office. Our fax number is [phone].  If you have an urgent issue when the clinic is closed that cannot wait until the next business day, you can page your doctor at the number below.    Please note that while we do our best to be available for urgent issues outside of office hours, we are not available 24/7.   If you have an urgent issue and are unable to reach [place], you may choose to seek medical care at your doctor's office, retail clinic, urgent care center, or emergency room.  If you have a medical emergency, please immediately call 911 or go to the emergency department.  Pager Numbers  - Dr. Gwen Pounds: [phone]  - Dr. Roseanne Reno: [phone]  - Dr. Katrinka Blazing: [phone]   In the event of inclement weather, please call our main line at [phone] for an update on the status of any delays or closures.  Dermatology Medication Tips: Please keep the boxes that topical medications come in in order to help keep track of the instructions about where and how to use these. Pharmacies typically print the medication instructions only on the boxes and not directly on the medication tubes.   If your medication is too expensive, please contact our office at [phone] option 4 or send [place] a message through MyChart.   We are unable to tell what your co-pay for medications will be in advance as this is different depending on your insurance coverage. However, we may be able to find a substitute medication at lower cost or fill out paperwork to get insurance to cover a needed medication.   If a prior authorization is required to get your medication  covered by your insurance company, please allow [place] 1-2 business days to complete this process.  Drug prices often vary depending on where the prescription is filled and some pharmacies may offer cheaper prices.  The website www.goodrx.com contains coupons for medications through different pharmacies. The prices here do not account for what the cost may be with help from insurance (it may be cheaper with your insurance), but the website can give you the price if you did not use any insurance.  - You can print the associated coupon and take it with your prescription to the pharmacy.  - You may also stop by our office during regular business hours and pick up a GoodRx coupon card.  - If you need your prescription sent electronically to a different pharmacy, notify our office through [facility] or by phone at  [phone] option 4.     Si Usted Necesita Algo Despus de Su Visita  Tambin puede enviarnos un mensaje a travs de Clinical cytogeneticist. Por lo general respondemos a los mensajes de MyChart en el transcurso de 1 a 2 das hbiles.  Para renovar recetas, por favor pida a su farmacia que se ponga en contacto con nuestra oficina. Annie Sable de fax es [place] [phone].  Si tiene un asunto urgente cuando la clnica est cerrada y que no puede esperar hasta el siguiente da hbil, puede llamar/localizar a su doctor(a) al nmero que aparece a continuacin.   Por favor, tenga en cuenta que aunque hacemos todo lo posible para estar disponibles para asuntos urgentes fuera del horario de [place], no estamos disponibles las 24 horas del da, los 7 [address]  [address] de la [place].   Si tiene un problema urgente y no puede comunicarse con nosotros, puede optar por buscar atencin mdica  en el consultorio de su doctor(a), en una clnica privada, en un centro de atencin urgente o en una sala de emergencias.  Si tiene Engineer, drilling, por favor llame inmediatamente al 911 o vaya a la sala de  emergencias.  Nmeros de bper  - Dr. Gwen Pounds: [phone]  - Dra. Roseanne Reno: [phone]  - Dr. Katrinka Blazing: [phone]   En caso de inclemencias del tiempo, por favor llame a Lacy Duverney principal al [phone] para una actualizacin sobre el [place] de cualquier retraso o cierre.  Consejos para la medicacin en dermatologa: Por favor, guarde las cajas en las que vienen los medicamentos de uso tpico para ayudarle a seguir las instrucciones sobre dnde y cmo usarlos. Las farmacias generalmente imprimen las instrucciones del medicamento slo en las cajas y no directamente en los tubos del [place].   Si su medicamento es muy caro, por favor, pngase en contacto con Rolm Gala llamando al [phone] y presione la opcin 4 o envenos un mensaje a travs de Clinical cytogeneticist.   No podemos decirle cul ser su copago por los medicamentos por adelantado ya que esto es diferente dependiendo de la cobertura de su seguro. Sin embargo, es posible que podamos encontrar un medicamento sustituto a Audiological scientist un formulario para que el seguro cubra el medicamento que se considera necesario.   Si se requiere una autorizacin previa para que su compaa de seguros [place] su medicamento, por favor permtanos de 1 a 2 das hbiles para completar [address]  Los precios de los medicamentos varan con frecuencia dependiendo del Environmental consultant de dnde se surte la receta y alguna farmacias pueden ofrecer precios ms baratos.  El sitio web www.goodrx.com tiene cupones para medicamentos de Health and safety inspector. Los precios aqu no tienen en cuenta lo que podra costar con la ayuda del seguro (puede ser ms barato con su seguro), pero el sitio web puede darle el precio si no utiliz Tourist information centre manager.  - Puede imprimir el cupn correspondiente y llevarlo con su receta a la farmacia.  - Tambin puede pasar por nuestra oficina durante el horario de atencin regular y Education officer, museum una tarjeta de cupones de GoodRx.  - Si  necesita que su receta se enve electrnicamente a una farmacia diferente, informe a nuestra oficina a travs de MyChart de [place] o por telfono llamando al [phone] y presione la opcin 4.

## 2023-08-13 NOTE — Progress Notes (Signed)
Follow-Up Visit   Subjective  Carolyn Cunningham is a 73 y.o. female who presents for the following: Skin Cancer Screening and Full Body Skin Exam  The patient presents for Total-Body Skin Exam (TBSE) for skin cancer screening and mole check. The patient has spots, moles and lesions to be evaluated, some may be new or changing and the patient may have concern these could be cancer.  Hx SCC. Patient saw Dr. Katrinka Blazing 06/16/23 and there was concern about a possible BCC at right upper cutaneous lip. Patient deferred bx since it was at the holidays.   The following portions of the chart were reviewed this encounter and updated as appropriate: medications, allergies, medical history  Review of Systems:  No other skin or systemic complaints except as noted in HPI or Assessment and Plan.  Objective  Well appearing patient in no apparent distress; mood and affect are within normal limits.  A full examination was performed including scalp, head, eyes, ears, nose, lips, neck, chest, axillae, abdomen, back, buttocks, bilateral upper extremities, bilateral lower extremities, hands, feet, fingers, toes, fingernails, and toenails. All findings within normal limits unless otherwise noted below.   Relevant physical exam findings are noted in the Assessment and Plan.  Right Upper Lip 2.5 mm pink pearly papule  Left Nasolabial Fold x 1, R medial calf x 1, L forearm x 2 (4) Erythematous stuck-on, waxy papule or plaque  chest x 6 (6) Erythematous thin papules/macules with gritty scale.     Assessment & Plan   SKIN CANCER SCREENING PERFORMED TODAY.  ACTINIC DAMAGE - Chronic condition, secondary to cumulative UV/sun exposure - diffuse scaly erythematous macules with underlying dyspigmentation - Recommend daily broad spectrum sunscreen SPF 30+ to sun-exposed areas, reapply every 2 hours as needed.  - Staying in the shade or wearing long sleeves, sun glasses (UVA+UVB protection) and wide brim hats (4-inch  brim around the entire circumference of the hat) are also recommended for sun protection.  - Call for new or changing lesions.  LENTIGINES, SEBORRHEIC KERATOSES, HEMANGIOMAS - Benign normal skin lesions - Benign-appearing - Call for any changes  Counseling for BBL / IPL / Laser and Coordination of Care for Lentigos Discussed the treatment option of Broad Band Light (BBL) /Intense Pulsed Light (IPL)/ Laser for skin discoloration, including brown spots and redness.  Typically we recommend at least 1-3 treatment sessions about 5-8 weeks apart for best results.  Cannot have tanned skin when BBL performed, and regular use of sunscreen/photoprotection is advised after the procedure to help maintain results. The patient's condition may also require "maintenance treatments" in the future.  The fee for BBL / laser treatments is $350 per treatment session for the whole face.  A fee can be quoted for other parts of the body.  Insurance typically does not pay for BBL/laser treatments and therefore the fee is an out-of-pocket cost. Recommend prophylactic valtrex treatment. Once scheduled for procedure, will send Rx in prior to patient's appointment.    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   HISTORY OF SQUAMOUS CELL CARCINOMA OF THE SKIN - No evidence of recurrence today - Recommend regular full body skin exams - Recommend daily broad spectrum sunscreen SPF 30+ to sun-exposed areas, reapply every  2 hours as needed.  - Call if any new or changing lesions are noted between office visits  ROSACEA Exam Erythema with small papules at nose, right zygoma  Chronic and persistent condition with duration or expected duration over  one year. Condition is bothersome/symptomatic for patient. Currently flared.   Rosacea is a chronic progressive skin condition usually affecting the face of adults, causing redness and/or acne bumps. It is treatable but not curable. It sometimes affects the eyes (ocular rosacea) as well. It may respond to topical and/or systemic medication and can flare with stress, sun exposure, alcohol, exercise, topical steroids (including hydrocortisone/cortisone 10) and some foods.  Daily application of broad spectrum spf 30+ sunscreen to face is recommended to reduce flares.  Treatment Plan Apply metronidazole 0.75% cr twice daily to aa face.      NEOPLASM OF UNCERTAIN BEHAVIOR OF SKIN Right Upper Lip Epidermal / dermal shaving  Lesion diameter (cm):  0.4 Informed consent: discussed and consent obtained   Patient was prepped and draped in usual sterile fashion: Area prepped with alcohol. Anesthesia: the lesion was anesthetized in a standard fashion   Anesthetic:  1% lidocaine w/ epinephrine 1-100,000 buffered w/ 8.4% NaHCO3 Instrument used: flexible razor blade   Hemostasis achieved with: pressure, aluminum chloride and electrodesiccation   Outcome: patient tolerated procedure well    Destruction of lesion  Destruction method: electrodesiccation and curettage   Informed consent: discussed and consent obtained   Curettage performed in three different directions: Yes   Electrodesiccation performed over the curetted area: Yes   Final wound size (cm):  0.4 Hemostasis achieved with:  pressure, aluminum chloride and electrodesiccation Outcome: patient tolerated procedure well with no complications   Post-procedure details: wound care instructions given   Post-procedure details comment:  Ointment and small bandage applied Specimen 1 - Surgical pathology Differential Diagnosis: r/o BCC  Check Margins: No 2.5 mm pink pearly papule ROSACEA   Related Medications metroNIDAZOLE (METROCREAM) 0.75  % cream Apply topically 2 (two) times daily. INFLAMED SEBORRHEIC KERATOSIS (4) Left Nasolabial Fold x 1, R medial calf x 1, L forearm x 2 (4) Symptomatic, irritating, patient would like treated.  Benign-appearing.  Call clinic for new or changing lesions.   residual at left forearm- previously frozen per pt  Destruction of lesion - Left Nasolabial Fold x 1, R medial calf x 1, L forearm x 2 (4)  Destruction method: cryotherapy   Informed consent: discussed and consent obtained   Lesion destroyed using liquid nitrogen: Yes   Region frozen until ice ball extended beyond lesion: Yes   Outcome: patient tolerated procedure well with no complications   Post-procedure details: wound care instructions given   Additional details:  Prior to procedure, discussed risks of blister formation, small wound, skin dyspigmentation, or rare scar following cryotherapy. Recommend Vaseline ointment to treated areas while healing.  AK (ACTINIC KERATOSIS) (6) chest x 6 (6) Actinic keratoses are precancerous spots that appear secondary to cumulative UV radiation exposure/sun exposure over time. They are chronic with expected duration over 1 year. A portion of actinic keratoses will progress to squamous cell carcinoma of the skin. It is not possible to reliably predict which spots will progress to skin cancer and so treatment is recommended to prevent development of skin cancer.  Recommend daily broad spectrum sunscreen SPF 30+ to sun-exposed areas, reapply every 2 hours as needed.  Recommend staying in the shade or wearing long sleeves, sun glasses (UVA+UVB protection) and wide brim hats (4-inch brim around the  entire circumference of the hat). Call for new or changing lesions. Destruction of lesion - chest x 6 (6)  Destruction method: cryotherapy   Informed consent: discussed and consent obtained   Lesion destroyed using liquid nitrogen: Yes   Region frozen until ice ball extended beyond lesion: Yes   Outcome:  patient tolerated procedure well with no complications   Post-procedure details: wound care instructions given   Additional details:  Prior to procedure, discussed risks of blister formation, small wound, skin dyspigmentation, or rare scar following cryotherapy. Recommend Vaseline ointment to treated areas while healing.  Return in about 6 months (around 02/10/2024) for TBSE, with Dr. Katrinka Blazing, Hx SCC, Hx AK.  Anise Salvo, RMA, am acting as scribe for Willeen Niece, MD .   Documentation: I have reviewed the above documentation for accuracy and completeness, and I agree with the above.  Willeen Niece, MD

## 2023-08-14 LAB — SURGICAL PATHOLOGY

## 2023-08-17 ENCOUNTER — Telehealth: Payer: Self-pay

## 2023-08-17 NOTE — Telephone Encounter (Signed)
Patient advised of BX results. aw

## 2023-08-17 NOTE — Telephone Encounter (Signed)
-----   Message from Willeen Niece sent at 08/17/2023 11:22 AM EST ----- 1. Skin, right upper lip :       BASAL CELL CARCINOMA, NODULAR PATTERN   BCC skin cancer- already treated with EDC at time of biopsy   - please call patient

## 2023-08-17 NOTE — Telephone Encounter (Signed)
Left message for patient to call back for biopsy results.

## 2024-02-11 ENCOUNTER — Ambulatory Visit (INDEPENDENT_AMBULATORY_CARE_PROVIDER_SITE_OTHER): Payer: Medicare Other | Admitting: Dermatology

## 2024-02-11 ENCOUNTER — Encounter: Payer: Self-pay | Admitting: Dermatology

## 2024-02-11 DIAGNOSIS — Z1283 Encounter for screening for malignant neoplasm of skin: Secondary | ICD-10-CM | POA: Diagnosis not present

## 2024-02-11 DIAGNOSIS — D229 Melanocytic nevi, unspecified: Secondary | ICD-10-CM

## 2024-02-11 DIAGNOSIS — L814 Other melanin hyperpigmentation: Secondary | ICD-10-CM | POA: Diagnosis not present

## 2024-02-11 DIAGNOSIS — L821 Other seborrheic keratosis: Secondary | ICD-10-CM

## 2024-02-11 DIAGNOSIS — L578 Other skin changes due to chronic exposure to nonionizing radiation: Secondary | ICD-10-CM

## 2024-02-11 DIAGNOSIS — D492 Neoplasm of unspecified behavior of bone, soft tissue, and skin: Secondary | ICD-10-CM

## 2024-02-11 DIAGNOSIS — D1801 Hemangioma of skin and subcutaneous tissue: Secondary | ICD-10-CM

## 2024-02-11 DIAGNOSIS — W908XXA Exposure to other nonionizing radiation, initial encounter: Secondary | ICD-10-CM

## 2024-02-11 DIAGNOSIS — Z85828 Personal history of other malignant neoplasm of skin: Secondary | ICD-10-CM

## 2024-02-11 DIAGNOSIS — D485 Neoplasm of uncertain behavior of skin: Secondary | ICD-10-CM

## 2024-02-11 DIAGNOSIS — Z7189 Other specified counseling: Secondary | ICD-10-CM

## 2024-02-11 NOTE — Patient Instructions (Signed)

## 2024-02-11 NOTE — Progress Notes (Signed)
 Follow-Up Visit   Subjective  Carolyn Cunningham is a 73 y.o. female who presents for the following: Skin Cancer Screening and Full Body Skin Exam hx of BCC, SCCs,Aks, check spot L upper arm, gets scaly  The patient presents for Total-Body Skin Exam (TBSE) for skin cancer screening and mole check. The patient has spots, moles and lesions to be evaluated, some may be new or changing and the patient may have concern these could be cancer.    The following portions of the chart were reviewed this encounter and updated as appropriate: medications, allergies, medical history  Review of Systems:  No other skin or systemic complaints except as noted in HPI or Assessment and Plan.  Objective  Well appearing patient in no apparent distress; mood and affect are within normal limits.  A full examination was performed including scalp, head, eyes, ears, nose, lips, neck, chest, axillae, abdomen, back, buttocks, bilateral upper extremities, bilateral lower extremities, hands, feet, fingers, toes, fingernails, and toenails. All findings within normal limits unless otherwise noted below.   Exam of toenails limited by presence of nail polish.  Exam of face limited by presence of make-up.  Relevant physical exam findings are noted in the Assessment and Plan.   L calf      Assessment & Plan   SKIN CANCER SCREENING PERFORMED TODAY.  LENTIGINES, SEBORRHEIC KERATOSES, HEMANGIOMAS - Benign normal skin lesions - Benign-appearing - Call for any changes - SK L upper arm  MELANOCYTIC NEVI - Tan-brown and/or pink-flesh-colored symmetric macules and papules - Benign appearing on exam today - Observation - Call clinic for new or changing moles - Recommend daily use of broad spectrum spf 30+ sunscreen to sun-exposed areas.   HISTORY OF BASAL CELL CARCINOMA OF THE SKIN - No evidence of recurrence today - Recommend regular full body skin exams - Recommend daily broad spectrum sunscreen SPF 30+ to  sun-exposed areas, reapply every 2 hours as needed.  - Call if any new or changing lesions are noted between office visits  - R upper lip  HISTORY OF SQUAMOUS CELL CARCINOMA OF THE SKIN - No evidence of recurrence today - No lymphadenopathy - Recommend regular full body skin exams - Recommend daily broad spectrum sunscreen SPF 30+ to sun-exposed areas, reapply every 2 hours as needed.  - Call if any new or changing lesions are noted between office visits - L lower ant mid leg, L lower ant leg   NEOPLASM L medial proximal calf, L post calf Exam: pink telangiectatic macules  Treatment Plan: Observe, recheck on f/u  ACTINIC DAMAGE WITH PRECANCEROUS ACTINIC KERATOSES Counseling for Topical Chemotherapy Management: Patient exhibits: - Severe, confluent actinic changes with pre-cancerous actinic keratoses that is secondary to cumulative UV radiation exposure over time - Condition that is severe; chronic, not at goal. - diffuse scaly erythematous macules and papules with underlying dyspigmentation - Discussed Prescription Field Treatment topical Chemotherapy for Severe, Chronic Confluent Actinic Changes with Pre-Cancerous Actinic Keratoses Field treatment involves treatment of an entire area of skin that has confluent Actinic Changes (Sun/ Ultraviolet light damage) and PreCancerous Actinic Keratoses by method of PhotoDynamic Therapy (PDT) and/or prescription Topical Chemotherapy agents such as 5-fluorouracil, 5-fluorouracil/calcipotriene, and/or imiquimod.  The purpose is to decrease the number of clinically evident and subclinical PreCancerous lesions to prevent progression to development of skin cancer by chemically destroying early precancer changes that may or may not be visible.  It has been shown to reduce the risk of developing skin cancer in the  treated area. As a result of treatment, redness, scaling, crusting, and open sores may occur during treatment course. One or more than one of  these methods may be used and may have to be used several times to control, suppress and eliminate the PreCancerous changes. Discussed treatment course, expected reaction, and possible side effects. - Recommend daily broad spectrum sunscreen SPF 30+ to sun-exposed areas, reapply every 2 hours as needed.  - Staying in the shade or wearing long sleeves, sun glasses (UVA+UVB protection) and wide brim hats (4-inch brim around the entire circumference of the hat) are also recommended. - Call for new or changing lesions.  - Consider topical field treatment arms, chest, and legs in the fall MULTIPLE BENIGN NEVI   LENTIGINES   ACTINIC ELASTOSIS   SEBORRHEIC KERATOSES   CHERRY ANGIOMA   NEOPLASM OF UNCERTAIN BEHAVIOR OF SKIN   Return in about 3 months (around 05/13/2024) for Discuss AK topical txt arms, legs, chest, recheck L medial proximal calf, L post calf.  I, Grayce Saunas, RMA, am acting as scribe for Boneta Sharps, MD .   Documentation: I have reviewed the above documentation for accuracy and completeness, and I agree with the above.  Boneta Sharps, MD

## 2024-04-13 ENCOUNTER — Other Ambulatory Visit: Payer: Self-pay | Admitting: Obstetrics and Gynecology

## 2024-04-13 DIAGNOSIS — Z1231 Encounter for screening mammogram for malignant neoplasm of breast: Secondary | ICD-10-CM

## 2024-05-03 ENCOUNTER — Other Ambulatory Visit: Payer: Self-pay | Admitting: Internal Medicine

## 2024-05-03 DIAGNOSIS — G453 Amaurosis fugax: Secondary | ICD-10-CM

## 2024-05-03 DIAGNOSIS — Z0000 Encounter for general adult medical examination without abnormal findings: Secondary | ICD-10-CM

## 2024-05-03 DIAGNOSIS — E782 Mixed hyperlipidemia: Secondary | ICD-10-CM

## 2024-05-03 DIAGNOSIS — I6523 Occlusion and stenosis of bilateral carotid arteries: Secondary | ICD-10-CM

## 2024-05-05 ENCOUNTER — Ambulatory Visit: Admission: RE | Admit: 2024-05-05 | Source: Ambulatory Visit

## 2024-05-05 ENCOUNTER — Ambulatory Visit
Admission: RE | Admit: 2024-05-05 | Discharge: 2024-05-05 | Disposition: A | Discharge status: Home / Self Care | Source: Ambulatory Visit | Attending: Internal Medicine | Admitting: Internal Medicine

## 2024-05-05 DIAGNOSIS — G453 Amaurosis fugax: Secondary | ICD-10-CM | POA: Insufficient documentation

## 2024-05-05 DIAGNOSIS — I6523 Occlusion and stenosis of bilateral carotid arteries: Secondary | ICD-10-CM | POA: Diagnosis present

## 2024-05-05 DIAGNOSIS — Z0000 Encounter for general adult medical examination without abnormal findings: Secondary | ICD-10-CM | POA: Insufficient documentation

## 2024-05-05 DIAGNOSIS — E782 Mixed hyperlipidemia: Secondary | ICD-10-CM | POA: Diagnosis present

## 2024-05-05 MED ORDER — IOHEXOL 350 MG/ML SOLN
75.0000 mL | Freq: Once | INTRAVENOUS | Status: AC | PRN
  Administered 2024-05-05: 75 mL via INTRAVENOUS

## 2024-05-12 ENCOUNTER — Encounter: Payer: Self-pay | Admitting: Dermatology

## 2024-05-12 ENCOUNTER — Ambulatory Visit: Admitting: Dermatology

## 2024-05-12 DIAGNOSIS — D492 Neoplasm of unspecified behavior of bone, soft tissue, and skin: Secondary | ICD-10-CM | POA: Diagnosis not present

## 2024-05-12 DIAGNOSIS — D099 Carcinoma in situ, unspecified: Secondary | ICD-10-CM

## 2024-05-12 DIAGNOSIS — L820 Inflamed seborrheic keratosis: Secondary | ICD-10-CM

## 2024-05-12 DIAGNOSIS — D0462 Carcinoma in situ of skin of left upper limb, including shoulder: Secondary | ICD-10-CM | POA: Diagnosis not present

## 2024-05-12 DIAGNOSIS — L570 Actinic keratosis: Secondary | ICD-10-CM

## 2024-05-12 HISTORY — DX: Carcinoma in situ, unspecified: D09.9

## 2024-05-12 MED ORDER — FLUOROURACIL 5 % EX CREA: TOPICAL_CREAM | Freq: Two times a day (BID) | CUTANEOUS | 2 refills | Status: AC

## 2024-05-12 NOTE — Patient Instructions (Addendum)
 Cryotherapy Aftercare  Wash gently with soap and water everyday.   Apply Vaseline and Band-Aid daily until healed.     - Start 5-fluorouracil/calcipotriene cream Apply twice daily to arms, chest, legs, left superior forehead until reaction develops, stop then allow to heal. Treat sections at a time.. Patient advised they will receive a phone call to purchase the medication and have it sent to their home. Patient provided with handout reviewing treatment course and side effects and advised to call or message us  on MyChart with any concerns.   [facility]  [address] [place], [place] [address]  Phone: [phone]     5-fluorouracil/calcipotriene cream is is a type of field treatment used to treat precancers, thin skin cancers, and areas of sun damage. Expected reaction includes irritation and mild inflammation potentially progressing to more severe inflammation including redness, scaling, crusting and open sores/erosions.  If too much irritation occurs, ensure application of only a thin layer and decrease frequency of use to achieve a tolerable level of inflammation. Recommend applying Vaseline ointment to open sores as needed.  Minimize sun exposure while under treatment. Recommend daily broad spectrum sunscreen SPF 30+ to sun-exposed areas, reapply every 2 hours as needed.         Wound Care Instructions  Cleanse wound gently with soap and water once a day then pat dry with clean gauze. Apply a thin coat of Petrolatum (petroleum jelly, Vaseline) over the wound (unless you have an allergy to this). We recommend that you use a new, sterile tube of Vaseline. Do not pick or remove scabs. Do not remove the yellow or white healing tissue from the base of the wound.  Cover the wound with fresh, clean, nonstick gauze and secure with paper tape. You may use Band-Aids in place of gauze and tape if the wound is small enough, but would recommend trimming much of the tape off as  there is often too much. Sometimes Band-Aids can irritate the skin.  You should call the office for your biopsy report after 1 week if you have not already been contacted.  If you experience any problems, such as abnormal amounts of bleeding, swelling, significant bruising, significant pain, or evidence of infection, please call the office immediately.  FOR ADULT SURGERY PATIENTS: If you need something for pain relief you may take 1 extra strength Tylenol  (acetaminophen ) AND 2 Ibuprofen  (200mg  each) together every 4 hours as needed for pain. (do not take these if you are allergic to them or if you have a reason you should not take them.) Typically, you may only need pain medication for 1 to 3 days.       Recommend daily broad spectrum sunscreen SPF 30+ to sun-exposed areas, reapply every 2 hours as needed. Call for new or changing lesions.  Staying in the shade or wearing long sleeves, sun glasses (UVA+UVB protection) and wide brim hats (4-inch brim around the entire circumference of the hat) are also recommended for sun protection.      Due to recent changes in healthcare laws, you may see results of your pathology and/or laboratory studies on MyChart before the doctors have had a chance to review them. We understand that in some cases there may be results that are confusing or concerning to you. Please understand that not all results are received at the same time and often the doctors may need to interpret multiple results in order to provide you with the best plan of care or course of treatment. Therefore, we  ask that you please give us  2 business days to thoroughly review all your results before contacting the office for clarification. Should we see a critical lab result, you will be contacted sooner.   If You Need Anything After Your Visit  If you have any questions or concerns for your doctor, please call our main line at [phone] and press option 4 to reach your doctor's medical  assistant. If no one answers, please leave a voicemail as directed and we will return your call as soon as possible. Messages left after 4 pm will be answered the following business day.   You may also send us  a message via MyChart. We typically respond to MyChart messages within 1-2 business days.  For prescription refills, please ask your pharmacy to contact our office. Our fax number is [phone].  If you have an urgent issue when the clinic is closed that cannot wait until the next business day, you can [place] your doctor at the number below.    Please note that while we do our best to be available for urgent issues outside of office hours, we are not available 24/7.   If you have an urgent issue and are unable to reach us , you may choose to seek medical care at your doctor's office, retail clinic, urgent care center, or emergency room.  If you have a medical emergency, please immediately call 911 or go to the emergency department.  Pager Numbers  - Dr. Hester: [phone]  - Dr. Jackquline: [phone]  - Dr. Claudene: [phone]   - Dr. Raymund: [phone]  In the event of inclement weather, please call our main line at [phone] for an update on the status of any delays or closures.  Dermatology Medication Tips: Please keep the boxes that topical medications come in in order to help keep track of the instructions about where and how to use these. Pharmacies typically print the medication instructions only on the boxes and not directly on the medication tubes.   If your medication is too expensive, please contact our office at [phone] option 4 or send us  a message through MyChart.   We are unable to tell what your co-pay for medications will be in advance as this is different depending on your insurance coverage. However, we may be able to find a substitute medication at lower cost or fill out paperwork to get insurance to cover a needed medication.   If a prior  authorization is required to get your medication covered by your insurance company, please allow us  1-2 business days to complete this process.  Drug prices often vary depending on where the prescription is filled and some pharmacies may offer cheaper prices.  The website www.goodrx.com contains coupons for medications through different pharmacies. The prices here do not account for what the cost may be with help from insurance (it may be cheaper with your insurance), but the website can give you the price if you did not use any insurance.  - You can print the associated coupon and take it with your prescription to the pharmacy.  - You may also stop by our office during regular business hours and pick up a GoodRx coupon card.  - If you need your prescription sent electronically to a different pharmacy, notify our office through [facility] or by phone at [phone] option 4.     Si Usted Necesita Algo Despus de Su Visita  Tambin puede enviarnos un mensaje a travs de Clinical cytogeneticist. Por lo general respondemos  a los mensajes de MyChart en el transcurso de 1 a 2 das hbiles.  Para renovar recetas, por favor pida a su farmacia que se ponga en contacto con nuestra oficina. [place]i lakes de fax es [place] [phone].  Si tiene un asunto urgente cuando la clnica est cerrada y que no puede esperar hasta el siguiente da hbil, puede llamar/localizar a su doctor(a) al nmero que aparece a continuacin.   Por favor, tenga en cuenta que aunque hacemos todo lo posible para estar disponibles para asuntos urgentes fuera del horario de [place], no estamos disponibles las 24 horas del da, los 7 [address]  [address] de la [place].   Si tiene un problema urgente y no puede comunicarse con nosotros, puede optar por buscar atencin mdica  en el consultorio de su doctor(a), en una clnica privada, en un centro de atencin urgente o en una sala de emergencias.  Si tiene Engineer, drilling, por favor llame  inmediatamente al 911 o vaya a la sala de emergencias.  Nmeros de bper  - Dr. Hester: [phone]  - Dra. Jackquline: [phone]  - Dr. Claudene: [phone]  - Dra. Kitts: [phone]  En caso de inclemencias del [place], por favor llame a nuestra lnea principal al [phone] para una actualizacin sobre el estado de cualquier retraso o cierre.  Consejos para la medicacin en dermatologa: Por favor, guarde las cajas en las que vienen los medicamentos de uso tpico para ayudarle a seguir las instrucciones sobre dnde y cmo usarlos. Las farmacias generalmente imprimen las instrucciones del medicamento slo en las cajas y no directamente en los tubos del [place].   Si su medicamento es muy caro, por favor, pngase en contacto con landry rieger llamando al [phone] y presione la opcin 4 o envenos un mensaje a travs de Clinical cytogeneticist.   No podemos decirle cul ser su copago por los medicamentos por adelantado ya que esto es diferente dependiendo de la cobertura de su seguro. Sin embargo, es posible que podamos encontrar un medicamento sustituto a Audiological scientist un formulario para que el seguro cubra el medicamento que se considera necesario.   Si se requiere una autorizacin previa para que su compaa de seguros malta su medicamento, por favor permtanos de 1 a 2 das hbiles para completar este proceso.  Los precios de los medicamentos varan con frecuencia dependiendo del Environmental consultant de dnde se surte la receta y alguna farmacias pueden ofrecer precios ms baratos.  El sitio web www.goodrx.com tiene cupones para medicamentos de Health and safety inspector. Los precios aqu no tienen en cuenta lo que podra costar con la ayuda del seguro (puede ser ms barato con su seguro), pero el sitio web puede darle el precio si no utiliz Tourist information centre manager.  - Puede imprimir el cupn correspondiente y llevarlo con su receta a la farmacia.  - Tambin puede pasar por nuestra oficina durante el horario  de atencin regular y Education officer, museum una tarjeta de cupones de GoodRx.  - Si necesita que su receta se enve electrnicamente a una farmacia diferente, informe a nuestra oficina a travs de MyChart de [place] o por telfono llamando al [phone] y presione la opcin 4.

## 2024-05-12 NOTE — Progress Notes (Signed)
 Follow-Up Visit   Subjective  Carolyn Cunningham is a 73 y.o. female who presents for the following: 3 month follow up. Recheck left calf.  Discuss topical field treatment for actinic damage on arms, legs and chest.   Check spot on left upper arm. Check face, did not wear makeup today.    The following portions of the chart were reviewed this encounter and updated as appropriate: medications, allergies, medical history  Review of Systems:  No other skin or systemic complaints except as noted in HPI or Assessment and Plan.  Objective  Well appearing patient in no apparent distress; mood and affect are within normal limits.  A focused examination was performed of the following areas: Face, arms, legs, chest  Relevant exam findings are noted in the Assessment and Plan.  Left Upper Arm - extensor 7 mm Pink scaly plaque   L calf x2 (2) Erythematous keratotic or waxy stuck-on papule or plaque. Left Superior Forehead Erythematous thin papules/macules with gritty scale.   Assessment & Plan   ACTINIC DAMAGE WITH PRECANCEROUS ACTINIC KERATOSES Counseling for Topical Chemotherapy Management: Patient exhibits: - Severe, confluent actinic changes with pre-cancerous actinic keratoses that is secondary to cumulative UV radiation exposure over time - Condition that is severe; chronic, not at goal. - diffuse scaly erythematous macules and papules with underlying dyspigmentation - Discussed Prescription Field Treatment topical Chemotherapy for Severe, Chronic Confluent Actinic Changes with Pre-Cancerous Actinic Keratoses Field treatment involves treatment of an entire area of skin that has confluent Actinic Changes (Sun/ Ultraviolet light damage) and PreCancerous Actinic Keratoses by method of PhotoDynamic Therapy (PDT) and/or prescription Topical Chemotherapy agents such as 5-fluorouracil, 5-fluorouracil/calcipotriene, and/or imiquimod.  The purpose is to decrease the number of clinically  evident and subclinical PreCancerous lesions to prevent progression to development of skin cancer by chemically destroying early precancer changes that may or may not be visible.  It has been shown to reduce the risk of developing skin cancer in the treated area. As a result of treatment, redness, scaling, crusting, and open sores may occur during treatment course. One or more than one of these methods may be used and may have to be used several times to control, suppress and eliminate the PreCancerous changes. Discussed treatment course, expected reaction, and possible side effects. - Recommend daily broad spectrum sunscreen SPF 30+ to sun-exposed areas, reapply every 2 hours as needed.  - Staying in the shade or wearing long sleeves, sun glasses (UVA+UVB protection) and wide brim hats (4-inch brim around the entire circumference of the hat) are also recommended. - Call for new or changing lesions. - Start 5-fluorouracil/calcipotriene cream Apply twice daily to arms, chest, legs, left superior forehead until reaction develops, stop then allow to heal. Treat sections at a time.. Patient advised they will receive a phone call to purchase the medication and have it sent to their home. Patient provided with handout reviewing treatment course and side effects and advised to call or message us  on MyChart with any concerns.  Reviewed course of treatment and expected reaction.  Patient advised to expect inflammation and crusting and advised that erosions are possible.  Patient advised to be diligent with sun protection during and after treatment. Counseled to keep medication out of reach of children and pets.   NEOPLASM OF SKIN Left Upper Arm - extensor Skin / nail biopsy Type of biopsy: tangential   Informed consent: discussed and consent obtained   Timeout: patient name, date of birth, surgical site, and procedure verified  Procedure prep:  Patient was prepped and draped in usual sterile fashion Prep  type:  Isopropyl alcohol Anesthesia: the lesion was anesthetized in a standard fashion   Anesthetic:  1% lidocaine w/ epinephrine 1-100,000 buffered w/ 8.4% NaHCO3 Instrument used: DermaBlade   Hemostasis achieved with: pressure and aluminum chloride   Outcome: patient tolerated procedure well   Post-procedure details: sterile dressing applied and wound care instructions given   Dressing type: bandage and petrolatum    Specimen 1 - Surgical pathology Differential Diagnosis: ISK vs prurigo nodule vs SCC  Check Margins: No INFLAMED SEBORRHEIC KERATOSIS (2) L calf x2 (2) Symptomatic, irritating, patient would like treated. Destruction of lesion - L calf x2 (2) Complexity: simple   Destruction method: cryotherapy   Informed consent: discussed and consent obtained   Timeout:  patient name, date of birth, surgical site, and procedure verified Lesion destroyed using liquid nitrogen: Yes   Region frozen until ice ball extended beyond lesion: Yes   Cryo cycles: 1 or 2. Outcome: patient tolerated procedure well with no complications   Post-procedure details: wound care instructions given   Additional details:  Prior to procedure, discussed risks of blister formation, small wound, skin dyspigmentation, or rare scar following cryotherapy. Recommend Vaseline ointment to treated areas while healing.   AK (ACTINIC KERATOSIS) Left Superior Forehead Actinic keratoses are precancerous spots that appear secondary to cumulative UV radiation exposure/sun exposure over time. They are chronic with expected duration over 1 year. A portion of actinic keratoses will progress to squamous cell carcinoma of the skin. It is not possible to reliably predict which spots will progress to skin cancer and so treatment is recommended to prevent development of skin cancer.  Recommend daily broad spectrum sunscreen SPF 30+ to sun-exposed areas, reapply every 2 hours as needed.  Recommend staying in the shade or wearing  long sleeves, sun glasses (UVA+UVB protection) and wide brim hats (4-inch brim around the entire circumference of the hat). Call for new or changing lesions.  Return in about 9 months (around 02/09/2025) for TBSE, HxSCC, HxBCC, HxAKs.  I, Jill Parcell, CMA, am acting as scribe for Boneta Sharps, MD.   Documentation: I have reviewed the above documentation for accuracy and completeness, and I agree with the above.  Boneta Sharps, MD

## 2024-05-16 ENCOUNTER — Ambulatory Visit
Admission: RE | Admit: 2024-05-16 | Discharge: 2024-05-16 | Disposition: A | Discharge status: Home / Self Care | Source: Ambulatory Visit | Attending: Obstetrics and Gynecology | Admitting: Obstetrics and Gynecology

## 2024-05-16 DIAGNOSIS — Z1231 Encounter for screening mammogram for malignant neoplasm of breast: Secondary | ICD-10-CM | POA: Insufficient documentation

## 2024-05-17 LAB — SURGICAL PATHOLOGY

## 2024-05-18 ENCOUNTER — Ambulatory Visit: Payer: Self-pay | Admitting: Dermatology

## 2024-05-19 ENCOUNTER — Encounter: Payer: Self-pay | Admitting: Dermatology

## 2024-05-19 NOTE — Telephone Encounter (Signed)
-----   Message from [place] sent at 05/18/2024  2:22 PM EDT ----- L upper arm ext, SCCIS, recommend EDC vs 5FU calcipotriene ----- Message ----- From: Interface, Lab In Three [facility] One Sent: 05/17/2024   2:59 PM EDT To: Boneta Sharps, MD

## 2024-05-19 NOTE — Telephone Encounter (Signed)
Left message for patient to return call regarding results  

## 2024-05-19 NOTE — Telephone Encounter (Signed)
 Patient advised pathology results and treatment Patient prefers to treat with topical 5FU/calcipotriene. I advised patient to wait at least another week before starting topical and to use twice daily until red and irritated.  Lonell RAMAN., RMA

## 2024-06-08 ENCOUNTER — Telehealth: Payer: Self-pay

## 2024-06-08 NOTE — Telephone Encounter (Signed)
 Patient advised of information per Dr. Felipe Horton. aw

## 2024-06-08 NOTE — Telephone Encounter (Signed)
 Patient left voicemail that she has been using the Calcipotriene/5FU mix to L upper arm ext, SCCIS for 7 days with no reaction. How long should patient continue?

## 2024-08-12 ENCOUNTER — Ambulatory Visit

## 2024-08-12 DIAGNOSIS — K5730 Diverticulosis of large intestine without perforation or abscess without bleeding: Secondary | ICD-10-CM | POA: Diagnosis not present

## 2024-08-12 DIAGNOSIS — R195 Other fecal abnormalities: Secondary | ICD-10-CM | POA: Diagnosis not present

## 2024-08-12 DIAGNOSIS — Z1211 Encounter for screening for malignant neoplasm of colon: Secondary | ICD-10-CM | POA: Diagnosis present

## 2024-08-12 DIAGNOSIS — K640 First degree hemorrhoids: Secondary | ICD-10-CM | POA: Diagnosis not present

## 2024-08-12 DIAGNOSIS — Z860101 Personal history of adenomatous and serrated colon polyps: Secondary | ICD-10-CM | POA: Diagnosis not present

## 2025-02-09 ENCOUNTER — Ambulatory Visit: Admitting: Dermatology
